# Patient Record
Sex: Female | Born: 1945 | Race: White | Hispanic: No | Smoking: Current every day smoker
Health system: Southern US, Community
[De-identification: ages and names within clinical notes are randomized; demographics above are authoritative.]

## PROBLEM LIST (undated history)

## (undated) DIAGNOSIS — B159 Hepatitis A without hepatic coma: Secondary | ICD-10-CM

## (undated) DIAGNOSIS — I10 Essential (primary) hypertension: Secondary | ICD-10-CM

## (undated) HISTORY — DX: Hepatitis a without hepatic coma: B15.9

## (undated) HISTORY — PX: ABDOMINAL HYSTERECTOMY: SHX81

## (undated) HISTORY — DX: Essential (primary) hypertension: I10

## (undated) HISTORY — PX: CHOLECYSTECTOMY: SHX55

---

## 2014-07-11 DIAGNOSIS — E780 Pure hypercholesterolemia: Secondary | ICD-10-CM | POA: Diagnosis not present

## 2014-07-11 DIAGNOSIS — I10 Essential (primary) hypertension: Secondary | ICD-10-CM | POA: Diagnosis not present

## 2014-07-17 DIAGNOSIS — E780 Pure hypercholesterolemia: Secondary | ICD-10-CM | POA: Diagnosis not present

## 2014-07-17 DIAGNOSIS — J302 Other seasonal allergic rhinitis: Secondary | ICD-10-CM | POA: Diagnosis not present

## 2014-07-17 DIAGNOSIS — I10 Essential (primary) hypertension: Secondary | ICD-10-CM | POA: Diagnosis not present

## 2014-07-17 DIAGNOSIS — J0100 Acute maxillary sinusitis, unspecified: Secondary | ICD-10-CM | POA: Diagnosis not present

## 2014-12-15 DIAGNOSIS — L500 Allergic urticaria: Secondary | ICD-10-CM | POA: Diagnosis not present

## 2015-01-09 DIAGNOSIS — E780 Pure hypercholesterolemia: Secondary | ICD-10-CM | POA: Diagnosis not present

## 2015-01-09 DIAGNOSIS — I10 Essential (primary) hypertension: Secondary | ICD-10-CM | POA: Diagnosis not present

## 2015-01-13 DIAGNOSIS — E780 Pure hypercholesterolemia: Secondary | ICD-10-CM | POA: Diagnosis not present

## 2015-01-13 DIAGNOSIS — Z23 Encounter for immunization: Secondary | ICD-10-CM | POA: Diagnosis not present

## 2015-01-13 DIAGNOSIS — I10 Essential (primary) hypertension: Secondary | ICD-10-CM | POA: Diagnosis not present

## 2016-01-06 ENCOUNTER — Ambulatory Visit (INDEPENDENT_AMBULATORY_CARE_PROVIDER_SITE_OTHER): Payer: Medicare Other | Admitting: Internal Medicine

## 2016-01-06 ENCOUNTER — Other Ambulatory Visit (INDEPENDENT_AMBULATORY_CARE_PROVIDER_SITE_OTHER): Payer: Medicare Other

## 2016-01-06 ENCOUNTER — Encounter: Payer: Self-pay | Admitting: Internal Medicine

## 2016-01-06 DIAGNOSIS — E785 Hyperlipidemia, unspecified: Secondary | ICD-10-CM | POA: Diagnosis not present

## 2016-01-06 DIAGNOSIS — K047 Periapical abscess without sinus: Secondary | ICD-10-CM | POA: Insufficient documentation

## 2016-01-06 DIAGNOSIS — I10 Essential (primary) hypertension: Secondary | ICD-10-CM

## 2016-01-06 DIAGNOSIS — R011 Cardiac murmur, unspecified: Secondary | ICD-10-CM

## 2016-01-06 LAB — COMPREHENSIVE METABOLIC PANEL
ALBUMIN: 4 g/dL (ref 3.5–5.2)
ALT: 13 U/L (ref 0–35)
AST: 20 U/L (ref 0–37)
Alkaline Phosphatase: 73 U/L (ref 39–117)
BILIRUBIN TOTAL: 0.5 mg/dL (ref 0.2–1.2)
BUN: 18 mg/dL (ref 6–23)
CALCIUM: 9 mg/dL (ref 8.4–10.5)
CHLORIDE: 104 meq/L (ref 96–112)
CO2: 30 meq/L (ref 19–32)
Creatinine, Ser: 0.67 mg/dL (ref 0.40–1.20)
GFR: 92.07 mL/min (ref >60.00)
Glucose, Bld: 91 mg/dL (ref 70–99)
Potassium: 4.2 mEq/L (ref 3.5–5.1)
Sodium: 139 mEq/L (ref 135–145)
Total Protein: 7.5 g/dL (ref 6.0–8.3)

## 2016-01-06 LAB — LIPID PANEL
CHOL/HDL RATIO: 3
CHOLESTEROL: 196 mg/dL (ref 0–200)
HDL: 61.5 mg/dL (ref >39.00)
LDL Cholesterol: 112 mg/dL — ABNORMAL HIGH (ref 0–99)
NonHDL: 134.94
TRIGLYCERIDES: 114 mg/dL (ref 0.0–149.0)
VLDL: 22.8 mg/dL (ref 0.0–40.0)

## 2016-01-06 LAB — CBC WITH DIFFERENTIAL/PLATELET
BASOS ABS: 0 10*3/uL (ref 0.0–0.1)
BASOS PCT: 0.2 % (ref 0.0–3.0)
EOS PCT: 0.6 % (ref 0.0–5.0)
Eosinophils Absolute: 0.1 10*3/uL (ref 0.0–0.7)
HEMATOCRIT: 43.4 % (ref 36.0–46.0)
Hemoglobin: 15 g/dL (ref 12.0–15.0)
LYMPHS ABS: 2.7 10*3/uL (ref 0.7–4.0)
Lymphocytes Relative: 29.9 % (ref 12.0–46.0)
MCHC: 34.6 g/dL (ref 30.0–36.0)
MCV: 95 fl (ref 78.0–100.0)
MONOS PCT: 7.4 % (ref 3.0–12.0)
Monocytes Absolute: 0.7 10*3/uL (ref 0.1–1.0)
NEUTROS ABS: 5.6 10*3/uL (ref 1.4–7.7)
NEUTROS PCT: 61.9 % (ref 43.0–77.0)
PLATELETS: 261 10*3/uL (ref 150.0–400.0)
RBC: 4.57 Mil/uL (ref 3.87–5.11)
RDW: 12.1 % (ref 11.5–15.5)
WBC: 9.1 10*3/uL (ref 4.0–10.5)

## 2016-01-06 LAB — TSH: TSH: 1.13 u[IU]/mL (ref 0.35–4.50)

## 2016-01-06 MED ORDER — THERA VITAL M PO TABS: 1.0000 | ORAL_TABLET | Freq: Every day | ORAL | Status: AC

## 2016-01-06 MED ORDER — BENAZEPRIL HCL 10 MG PO TABS: 10.0000 mg | ORAL_TABLET | Freq: Every day | ORAL | 3 refills | Status: DC

## 2016-01-06 MED ORDER — OMEGA 3 1200 MG PO CAPS: ORAL_CAPSULE | ORAL | Status: AC

## 2016-01-06 MED ORDER — VARENICLINE TARTRATE 0.5 MG X 11 & 1 MG X 42 PO MISC: ORAL | 0 refills | Status: DC

## 2016-01-06 MED ORDER — AMLODIPINE BESYLATE 5 MG PO TABS: 5.0000 mg | ORAL_TABLET | Freq: Every day | ORAL | 3 refills | Status: DC

## 2016-01-06 MED ORDER — CLINDAMYCIN HCL 300 MG PO CAPS: 300.0000 mg | ORAL_CAPSULE | Freq: Three times a day (TID) | ORAL | 0 refills | Status: DC

## 2016-01-06 NOTE — Progress Notes (Signed)
Pre visit review using our clinic review tool, if applicable. No additional management support is needed unless otherwise documented below in the visit note. 

## 2016-01-06 NOTE — Assessment & Plan Note (Signed)
Will obtain prior records Consider ordering an echo

## 2016-01-06 NOTE — Patient Instructions (Addendum)
  Test(s) ordered today. Your results will be released to MyChart (or called to you) after review, usually within 72hours after test completion. If any changes need to be made, you will be notified at that same time.   No immunizations administered today.   Medications reviewed and updated.  Changes include starting an antibiotic - clindamycin for your dental infection.    I also sent a prescription for chantix to your pharmacy.  Your prescription(s) have been submitted to your pharmacy. Please take as directed and contact our office if you believe you are having problem(s) with the medication(s).   Please followup in 6 months

## 2016-01-06 NOTE — Assessment & Plan Note (Signed)
BP well controlled, at goal of < 150/90 Current regimen effective and well tolerated Continue current medications at current doses Check labs

## 2016-01-06 NOTE — Progress Notes (Signed)
Subjective:    Patient ID: Holly Gilmore, female    DOB: Mar 03, 1946, 70 y.o.   MRN: [id number]  HPI She is here to establish with a new pcp.    She has a dental infection.  She did see a dentist and has an infection and she needs to see an orthodontist, but can not see one right away.  She was hoping to get an antibiotic so she can wait until next month.  She has no pain.  She has no fever.    Hypertension: She is taking her medication daily. She is compliant with a low sodium diet.  She denies chest pain, palpitations, edema, shortness of breath and regular headaches. She is not exercising regularly, but is active.  She does not monitor her blood pressure at home.     Medications and allergies reviewed with patient and updated if appropriate.  Patient Active Problem List   Diagnosis Date Noted  . Essential hypertension, benign 01/06/2016  . Dental infection 01/06/2016  . Hyperlipidemia 01/06/2016  . Heart murmur 01/06/2016    No current outpatient prescriptions on file prior to visit.   No current facility-administered medications on file prior to visit.     Past Medical History:  Diagnosis Date  . Hepatitis A   . Hypertension     Past Surgical History:  Procedure Laterality Date  . ABDOMINAL HYSTERECTOMY    . CHOLECYSTECTOMY      Social History   Social History  . Marital status: Widowed    Spouse name: N/A  . Number of children: N/A  . Years of education: N/A   Social History Main Topics  . Smoking status: Current Every Day Smoker  . Smokeless tobacco: Never Used     Comment: working on quitting - plans on going cold [place]  . Alcohol use Yes     Comment: occasional  . Drug use: No  . Sexual activity: Not Asked   Other Topics Concern  . None   Social History Narrative   No regular exercise   Gardening, canning    Family History  Problem Relation Age of Onset  . Arthritis Mother   . Hypertension Mother   . Cancer Father     prost  .  Alcohol abuse Brother     Review of Systems  Constitutional: Negative for appetite change, chills, fatigue, fever and unexpected weight change.  HENT: Positive for dental problem. Negative for sore throat.   Eyes: Negative for visual disturbance.  Respiratory: Positive for cough (from smoking). Negative for shortness of breath and wheezing.   Cardiovascular: Negative for chest pain, palpitations and leg swelling.  Gastrointestinal: Negative for abdominal pain, blood in stool, constipation, diarrhea and nausea.       No gerd  Genitourinary: Negative for dysuria and hematuria.  Musculoskeletal: Negative for arthralgias and back pain.  Neurological: Negative for light-headedness and headaches.  Psychiatric/Behavioral: Negative for dysphoric mood. The patient is not nervous/anxious.        Objective:   Vitals:   01/06/16 1005  BP: 140/80  Pulse: (!) 57  Resp: 16  Temp: 97.4 F (36.3 C)   Filed Weights   01/06/16 1005  Weight: 144 lb (65.3 kg)   Body mass index is 22.89 kg/m.   Physical Exam Constitutional: She appears well-developed and well-nourished. No distress.  HENT:  Head: Normocephalic and atraumatic.  Right Ear: External ear normal. Normal ear canal and TM Left Ear: External ear normal.  Normal ear canal  and TM Mouth/Throat: Oropharynx is clear and moist.  Eyes: Conjunctivae and EOM are normal.  Neck: Neck supple. No tracheal deviation present. No thyromegaly present.  No carotid bruit  Cardiovascular: Normal rate, regular rhythm and normal heart sounds.   2/6 systolic murmur heard.  No edema. Pulmonary/Chest: Effort normal and breath sounds normal. No respiratory distress. She has no wheezes. She has no rales.  Abdominal: Soft. She exhibits no distension. There is no tenderness.  Lymphadenopathy: She has no cervical adenopathy.  Skin: Skin is warm and dry. She is not diaphoretic.  Psychiatric: She has a normal mood and affect. Her behavior is normal.         Assessment & Plan:   See Problem List for Assessment and Plan of chronic medical problems.

## 2016-01-06 NOTE — Assessment & Plan Note (Signed)
Infection confirmed by dentist - was referred to orthodontist Unable to go until next month - I will prescribe clindamycin so she can wait until next month when she can afford it She will f/u with orthodontist

## 2016-01-06 NOTE — Assessment & Plan Note (Signed)
Check lipid panel  

## 2016-01-13 DIAGNOSIS — H2513 Age-related nuclear cataract, bilateral: Secondary | ICD-10-CM | POA: Diagnosis not present

## 2016-01-13 DIAGNOSIS — H5203 Hypermetropia, bilateral: Secondary | ICD-10-CM | POA: Diagnosis not present

## 2016-01-13 DIAGNOSIS — H04123 Dry eye syndrome of bilateral lacrimal glands: Secondary | ICD-10-CM | POA: Diagnosis not present

## 2016-01-13 DIAGNOSIS — H524 Presbyopia: Secondary | ICD-10-CM | POA: Diagnosis not present

## 2016-02-12 ENCOUNTER — Other Ambulatory Visit: Payer: Self-pay | Admitting: Internal Medicine

## 2016-02-12 MED ORDER — VARENICLINE TARTRATE 1 MG PO TABS: 1.0000 mg | ORAL_TABLET | Freq: Two times a day (BID) | ORAL | 0 refills | Status: DC

## 2016-02-12 NOTE — Telephone Encounter (Signed)
Refill sent for continuing pack, starter pack refused.

## 2016-02-23 DIAGNOSIS — Z23 Encounter for immunization: Secondary | ICD-10-CM | POA: Diagnosis not present

## 2016-07-03 ENCOUNTER — Emergency Department (HOSPITAL_COMMUNITY): Payer: PPO

## 2016-07-03 ENCOUNTER — Encounter (HOSPITAL_COMMUNITY): Payer: Self-pay | Admitting: Emergency Medicine

## 2016-07-03 ENCOUNTER — Emergency Department (HOSPITAL_COMMUNITY)
Admission: EM | Admit: 2016-07-03 | Discharge: 2016-07-03 | Disposition: A | Discharge status: Home / Self Care | Payer: PPO | Attending: Emergency Medicine | Admitting: Emergency Medicine

## 2016-07-03 DIAGNOSIS — Z79899 Other long term (current) drug therapy: Secondary | ICD-10-CM | POA: Insufficient documentation

## 2016-07-03 DIAGNOSIS — J209 Acute bronchitis, unspecified: Secondary | ICD-10-CM | POA: Diagnosis not present

## 2016-07-03 DIAGNOSIS — J40 Bronchitis, not specified as acute or chronic: Secondary | ICD-10-CM | POA: Diagnosis not present

## 2016-07-03 DIAGNOSIS — I10 Essential (primary) hypertension: Secondary | ICD-10-CM | POA: Insufficient documentation

## 2016-07-03 DIAGNOSIS — R05 Cough: Secondary | ICD-10-CM | POA: Diagnosis not present

## 2016-07-03 DIAGNOSIS — Z87891 Personal history of nicotine dependence: Secondary | ICD-10-CM | POA: Insufficient documentation

## 2016-07-03 MED ORDER — ALBUTEROL SULFATE HFA 108 (90 BASE) MCG/ACT IN AERS
2.0000 | INHALATION_SPRAY | Freq: Once | RESPIRATORY_TRACT | Status: AC
  Administered 2016-07-03: 2 via RESPIRATORY_TRACT
  Filled 2016-07-03: qty 6.7

## 2016-07-03 NOTE — ED Notes (Signed)
Pt refuses chest x-ray. Radiology and MD notified.

## 2016-07-03 NOTE — ED Triage Notes (Signed)
Patient c/o cough and sore throat that started this morning. Unsure of any fevers.

## 2016-07-03 NOTE — ED Provider Notes (Signed)
[facility]Y [facility]PT Provider Note   CSN: [id number] Arrival date & time: 07/03/16  4098  By signing my name below, I, Doreatha Martin, attest that this documentation has been prepared under the direction and in the presence of Azalia Bilis, MD. Electronically Signed: Doreatha Martin, ED Scribe. 07/03/16. 10:34 AM.    History   Chief Complaint Chief Complaint  Patient presents with  . Cough    HPI Holly Gilmore is a 71 y.o. female who presents to the Emergency Department complaining of improving, frequent cough that began last night with associated bilateral arm aching. She also describes the sensation that a "fist was stuck in her throat" last night, which has currently resolved. Pt states her symptoms have improved since last night. No worsening factors noted. No h/o asthma. Pt is a former intermittent smoker since age 79, quit 5.5 months ago. She denies fever, nausea, vomiting.    The history is provided by the patient. No language interpreter was used.    Past Medical History:  Diagnosis Date  . Hepatitis A   . Hypertension     Patient Active Problem List   Diagnosis Date Noted  . Essential hypertension, benign 01/06/2016  . Dental infection 01/06/2016  . Hyperlipidemia 01/06/2016  . Heart murmur 01/06/2016    Past Surgical History:  Procedure Laterality Date  . ABDOMINAL HYSTERECTOMY    . CHOLECYSTECTOMY      OB History    Gravida Para Term Preterm AB Living   2 2 2     1    SAB TAB Ectopic Multiple Live Births                   Home Medications    Prior to Admission medications   Medication Sig Start Date End Date Taking? Authorizing Provider  amLODipine (NORVASC) 5 MG tablet Take 1 tablet (5 mg total) by mouth daily. 01/06/16   Pincus Sanes, MD  benazepril (LOTENSIN) 10 MG tablet Take 1 tablet (10 mg total) by mouth daily. 01/06/16   Pincus Sanes, MD  clindamycin (CLEOCIN) 300 MG capsule Take 1 capsule (300 mg total) by mouth 3 (three) times daily. 01/06/16    Pincus Sanes, MD  cycloSPORINE (RESTASIS) 0.05 % ophthalmic emulsion 1 drop 2 (two) times daily.    Historical Provider, MD  Multiple Vitamins-Minerals (MULTIVITAMIN) tablet Take 1 tablet by mouth daily. 01/06/16   Pincus Sanes, MD  Omega 3 1200 MG CAPS Takes daily 01/06/16   Pincus Sanes, MD  varenicline (CHANTIX CONTINUING MONTH PAK) 1 MG tablet Take 1 tablet (1 mg total) by mouth 2 (two) times daily. 02/12/16   Pincus Sanes, MD  varenicline (CHANTIX STARTING MONTH PAK) 0.5 MG X 11 & 1 MG X 42 tablet one 0.5 mg tab by mouth once daily for 3 days, then increase to one 0.5 mg tab BID for 4 days, then increase to one 1 mg tab BID 01/06/16   Pincus Sanes, MD    Family History Family History  Problem Relation Age of Onset  . Arthritis Mother   . Hypertension Mother   . Cancer Father     prost  . Alcohol abuse Brother     Social History Social History  Substance Use Topics  . Smoking status: Former Smoker    Packs/day: 0.50    Years: 40.00    Types: Cigarettes    Quit date: 01/30/2016  . Smokeless tobacco: Never Used     Comment: working on  quitting - plans on going cold [place]  . Alcohol use Yes     Comment: occasional     Allergies   Penicillins   Review of Systems Review of Systems A complete 10 system review of systems was obtained and all systems are negative except as noted in the HPI and PMH.    Physical Exam Updated Vital Signs BP 161/81 (BP Location: Right Arm)   Pulse 76   Temp 98.4 F (36.9 C) (Oral)   Resp 18   Ht 5\' 6"  (1.676 m)   Wt 150 lb (68 kg)   SpO2 97%   BMI 24.21 kg/m   Physical Exam  Constitutional: She is oriented to person, place, and time. She appears well-developed and well-nourished. No distress.  HENT:  Head: Normocephalic and atraumatic.  Eyes: EOM are normal.  Neck: Normal range of motion.  Cardiovascular: Normal rate, regular rhythm and normal heart sounds.   Pulmonary/Chest: Effort normal and breath sounds normal. She has no  wheezes. She has no rales.  Abdominal: Soft. She exhibits no distension. There is no tenderness.  Musculoskeletal: Normal range of motion.  Neurological: She is alert and oriented to person, place, and time.  Skin: Skin is warm and dry.  Psychiatric: She has a normal mood and affect. Judgment normal.  Nursing note and vitals reviewed.    ED Treatments / Results   DIAGNOSTIC STUDIES: Oxygen Saturation is 97% on RA, normal by my interpretation.    COORDINATION OF CARE: 10:32 AM Discussed treatment plan with pt at bedside which includes CXR, albuterol inhaler and pt agreed to plan.    Labs (all labs ordered are listed, but only abnormal results are displayed) Labs Reviewed - No data to display  EKG  EKG Interpretation None       Radiology No results found.  Procedures Procedures (including critical care time)  Medications Ordered in ED Medications - No data to display   Initial Impression / Assessment and Plan / ED Course  I have reviewed the triage vital signs and the nursing notes.  Pertinent labs & imaging results that were available during my care of the patient were reviewed by me and considered in my medical decision making (see chart for details).     Patient is overall well-appearing.  Vital signs are stable.  This is likely bronchitis.  Patient be given albuterol inhaler.  I felt as though the patient benefit from a chest x-ray to evaluate for occult pneumonia versus other symptoms which may be causing her cough.  Patient refuses chest x-ray at this time and understands that this is an incomplete evaluation emergency department.  She understands to return to the ER for new or worsening symptoms.  I'm not convinced clinically that she has pneumonia and therefore I do not think she needs antibiotics.  Discharge home in good condition.  Final Clinical Impressions(s) / ED Diagnoses   Final diagnoses:  Bronchitis    New Prescriptions Current Discharge  Medication List      I personally performed the services described in this documentation, which was scribed in my presence. The recorded information has been reviewed and is accurate.        Azalia BilisKevin Javian Nudd, MD 07/03/16 1110

## 2016-07-13 ENCOUNTER — Ambulatory Visit: Payer: Medicare Other | Admitting: Internal Medicine

## 2016-07-21 ENCOUNTER — Ambulatory Visit (INDEPENDENT_AMBULATORY_CARE_PROVIDER_SITE_OTHER): Payer: PPO | Admitting: Internal Medicine

## 2016-07-21 ENCOUNTER — Encounter: Payer: Self-pay | Admitting: Internal Medicine

## 2016-07-21 VITALS — BP 172/80 | HR 65 | Temp 97.7°F | Resp 16 | Ht 66.0 in | Wt 167.0 lb

## 2016-07-21 DIAGNOSIS — I10 Essential (primary) hypertension: Secondary | ICD-10-CM | POA: Diagnosis not present

## 2016-07-21 DIAGNOSIS — R011 Cardiac murmur, unspecified: Secondary | ICD-10-CM

## 2016-07-21 MED ORDER — MUPIROCIN CALCIUM 2 % EX CREA: 1.0000 "application " | TOPICAL_CREAM | Freq: Two times a day (BID) | CUTANEOUS | 2 refills | Status: DC | PRN

## 2016-07-21 MED ORDER — BENAZEPRIL HCL 20 MG PO TABS: 20.0000 mg | ORAL_TABLET | Freq: Every day | ORAL | 3 refills | Status: DC

## 2016-07-21 NOTE — Progress Notes (Signed)
Pre visit review using our clinic review tool, if applicable. No additional management support is needed unless otherwise documented below in the visit note. 

## 2016-07-21 NOTE — Progress Notes (Signed)
Subjective:    Patient ID: Holly Gilmore, female    DOB: [birth year], 71 y.o.   MRN: [id number]  HPI The patient is here for follow up.  Hypertension: She is taking her medication daily. She is compliant with a low sodium diet.  She denies chest pain, palpitations, edema, shortness of breath and regular headaches. She is exercising regularly.  She does not monitor her blood pressure at home.    Hyperlipidemia: She is controlling her cholesterol with diet. She is compliant with a low fat/cholesterol diet. She is exercising regularly.   She would like bactroban to have at home for nicks she gets when she gardens.   Medications and allergies reviewed with patient and updated if appropriate.  Patient Active Problem List   Diagnosis Date Noted  . Essential hypertension, benign 01/06/2016  . Dental infection 01/06/2016  . Hyperlipidemia 01/06/2016  . Heart murmur 01/06/2016    Current Outpatient Prescriptions on File Prior to Visit  Medication Sig Dispense Refill  . amLODipine (NORVASC) 5 MG tablet Take 1 tablet (5 mg total) by mouth daily. 90 tablet 3  . benazepril (LOTENSIN) 10 MG tablet Take 1 tablet (10 mg total) by mouth daily. 90 tablet 3  . cycloSPORINE (RESTASIS) 0.05 % ophthalmic emulsion 1 drop 2 (two) times daily.    . Multiple Vitamins-Minerals (MULTIVITAMIN) tablet Take 1 tablet by mouth daily.    . Omega 3 1200 MG CAPS Takes daily    . vitamin C (ASCORBIC ACID) 500 MG tablet Take 1,000 mg by mouth daily.     No current facility-administered medications on file prior to visit.     Past Medical History:  Diagnosis Date  . Hepatitis A   . Hypertension     Past Surgical History:  Procedure Laterality Date  . ABDOMINAL HYSTERECTOMY    . CHOLECYSTECTOMY      Social History   Social History  . Marital status: Widowed    Spouse name: N/A  . Number of children: N/A  . Years of education: N/A   Social History Main Topics  . Smoking status: Former Smoker    Packs/day: 0.50    Years: 40.00    Types: Cigarettes    Quit date: 01/30/2016  . Smokeless tobacco: Never Used     Comment: working on quitting - plans on going cold [place]  . Alcohol use Yes     Comment: occasional  . Drug use: No  . Sexual activity: Not on file   Other Topics Concern  . Not on file   Social History Narrative   No regular exercise   Gardening, canning    Family History  Problem Relation Age of Onset  . Arthritis Mother   . Hypertension Mother   . Cancer Father     prost  . Alcohol abuse Brother     Review of Systems  Constitutional: Negative for fever.  Eyes: Positive for visual disturbance (had eye exam in last few months).  Respiratory: Negative for cough, shortness of breath and wheezing.   Cardiovascular: Negative for chest pain, palpitations and leg swelling.  Neurological: Negative for dizziness, light-headedness and headaches.       Objective:   Vitals:   07/21/16 0954 07/21/16 1008  BP: (!) 204/90 (!) 172/80  Pulse: 65   Resp: 16   Temp: 97.7 F (36.5 C)    Wt Readings from Last 3 Encounters:  07/21/16 167 lb (75.8 kg)  07/03/16 150 lb (68 kg)  01/06/16 144  lb (65.3 kg)   Body mass index is 26.95 kg/m.   Physical Exam    Constitutional: Appears well-developed and well-nourished. No distress.  HENT:  Head: Normocephalic and atraumatic.  Neck: Neck supple. No tracheal deviation present. No thyromegaly present.  No cervical lymphadenopathy Cardiovascular: Normal rate, regular rhythm and normal heart sounds.   2/6 systolic murmur heard. No carotid bruit .  No edema Pulmonary/Chest: Effort normal and breath sounds normal. No respiratory distress. No has no wheezes. No rales.  Skin: Skin is warm and dry. Not diaphoretic.  Psychiatric: Normal mood and affect. Behavior is normal.      Assessment & Plan:    See Problem List for Assessment and Plan of chronic medical problems.

## 2016-07-21 NOTE — Patient Instructions (Addendum)
   All other Health Maintenance issues reviewed.   All recommended immunizations and age-appropriate screenings are up-to-date or discussed.  No immunizations administered today.   Medications reviewed and updated.  Changes include increasing benazepril to 20 mg daily.  Continue the amlodipine 5 mg daily.   Your prescription(s) have been submitted to your pharmacy. Please take as directed and contact our office if you believe you are having problem(s) with the medication(s).   Please followup in 4 weeks

## 2016-07-21 NOTE — Assessment & Plan Note (Addendum)
Elevated today  - possibly related to weight gain with quitting smoke, ? Stress from driving here Discussed goal of BP < 140/90 and ideally < 130/80 Will amlodipine 5 mg daily Increase benazepril to 20 mg daily cmp

## 2016-07-21 NOTE — Assessment & Plan Note (Signed)
2/6 murmur Will monitor Stressed good BP control Consider echo

## 2016-09-06 ENCOUNTER — Telehealth: Payer: Self-pay | Admitting: Internal Medicine

## 2016-09-06 NOTE — Telephone Encounter (Signed)
Please advise 

## 2016-09-06 NOTE — Telephone Encounter (Signed)
Pt called stating benazepril (LOTENSIN) 20 MG tablet used to be  and would like to have it back to . States  is too much, but is not causing her any negative symptoms.   Our connection broke and I was not able to ask her pharmacy, called back three times and line busy. Will edit note if she calls back

## 2016-09-06 NOTE — Telephone Encounter (Signed)
What has her BP been at home?   If controlled we can decrease back to 10 mg daily, but she needs to monitor BP closely

## 2016-09-07 MED ORDER — BENAZEPRIL HCL 10 MG PO TABS: 10.0000 mg | ORAL_TABLET | Freq: Every day | ORAL | 1 refills | Status: DC

## 2016-09-07 NOTE — Telephone Encounter (Signed)
Called pt no answer LMOM RTC.../lmb 

## 2016-09-07 NOTE — Telephone Encounter (Signed)
Pt states her BP 138/68 a couple days ago while doing activity and taking  of her BP medicine.

## 2016-09-07 NOTE — Telephone Encounter (Signed)
Notified pt w/MD response. Pt states she never took 20 mg, always been taking , and want to continue. The script that was sent in back in March pt states she has been cutting the pills in half. Requesting her script to be change at the pharmacy for 10 mg.../lmb

## 2016-09-07 NOTE — Telephone Encounter (Signed)
Ok, sent

## 2016-09-07 NOTE — Telephone Encounter (Signed)
That BP of 138/68 is good - I would not want it any higher - if she was taking the 20 mg of benazepril at that time she likely needs to continue that dose to prevent her BP from going higher.

## 2016-09-30 DIAGNOSIS — H16223 Keratoconjunctivitis sicca, not specified as Sjogren's, bilateral: Secondary | ICD-10-CM | POA: Diagnosis not present

## 2016-09-30 DIAGNOSIS — H2513 Age-related nuclear cataract, bilateral: Secondary | ICD-10-CM | POA: Diagnosis not present

## 2017-01-12 ENCOUNTER — Other Ambulatory Visit: Payer: Self-pay | Admitting: Internal Medicine

## 2017-03-08 NOTE — Progress Notes (Signed)
Pre visit review using our clinic review tool, if applicable. No additional management support is needed unless otherwise documented below in the visit note. 

## 2017-03-08 NOTE — Progress Notes (Addendum)
Subjective:   Holly Gilmore is a 71 y.o. female who presents for an Initial Medicare Annual Wellness Visit.  Review of Systems    No ROS.  Medicare Wellness Visit. Additional risk factors are reflected in the social history.   Cardiac Risk Factors include: advanced age (>73men, >46 women);dyslipidemia;hypertension Sleep patterns: feels rested on waking, gets up 1 times nightly to void and sleeps 7-8 hours nightly.    Home Safety/Smoke Alarms: Feels safe in home. Smoke alarms in place.  Living environment; residence and Firearm Safety: 2-story house, no firearms.Lives alone, no needs for DME, good support system Seat Belt Safety/Bike Helmet: Wears seat belt.    Objective:    Today's Vitals   03/09/17 1127  BP: (!) 158/82  Pulse: 63  Resp: 20  SpO2: 99%  Weight: 155 lb (70.3 kg)  Height: 5\' 6"  (1.676 m)   Body mass index is 25.02 kg/m.   Current Medications (verified) Outpatient Encounter Prescriptions as of 03/09/2017  Medication Sig  . amLODipine (NORVASC) 5 MG tablet TAKE 1 TABLET (5 MG TOTAL) BY MOUTH DAILY.  . benazepril (LOTENSIN) 10 MG tablet Take 1 tablet (10 mg total) by mouth daily.  . cycloSPORINE (RESTASIS) 0.05 % ophthalmic emulsion 1 drop 2 (two) times daily.  . Multiple Vitamins-Minerals (MULTIVITAMIN) tablet Take 1 tablet by mouth daily.  . mupirocin cream (BACTROBAN) 2 % Apply 1 application topically 2 (two) times daily as needed.  . Omega 3 1200 MG CAPS Takes daily  . vitamin C (ASCORBIC ACID) 500 MG tablet Take 1,000 mg by mouth daily.   No facility-administered encounter medications on file as of 03/09/2017.     Allergies (verified) Penicillins   History: Past Medical History:  Diagnosis Date  . Hepatitis A   . Hypertension    Past Surgical History:  Procedure Laterality Date  . ABDOMINAL HYSTERECTOMY    . CHOLECYSTECTOMY     Family History  Problem Relation Age of Onset  . Arthritis Mother   . Hypertension Mother   . Cancer  Father        prost  . Alcohol abuse Brother    Social History   Occupational History  . Not on file.   Social History Main Topics  . Smoking status: Current Every Day Smoker    Packs/day: 1.50    Years: 40.00    Types: Cigarettes    Last attempt to quit: 01/30/2016  . Smokeless tobacco: Never Used     Comment: patient states that she started smoking again and does not want to try to quit again at this time.  . Alcohol use Yes     Comment: occasional  . Drug use: No  . Sexual activity: Not on file    Tobacco Counseling Ready to quit: Not Answered Counseling given: Not Answered   Activities of Daily Living In your present state of health, do you have any difficulty performing the following activities: 03/09/2017  Hearing? N  Vision? N  Difficulty concentrating or making decisions? N  Walking or climbing stairs? N  Dressing or bathing? N  Doing errands, shopping? N  Preparing Food and eating ? N  Using the Toilet? N  In the past six months, have you accidently leaked urine? N  Do you have problems with loss of bowel control? N  Managing your Medications? N  Managing your Finances? N  Housekeeping or managing your Housekeeping? N  Some recent data might be hidden    Immunizations and Health Maintenance Immunization  History  Administered Date(s) Administered  . Influenza-Unspecified 02/23/2016  . Pneumococcal Polysaccharide-23 05/17/2010  . Zoster 05/18/2003   Health Maintenance Due  Topic Date Due  . Hepatitis C Screening  01/23/1946  . TETANUS/TDAP  06/08/1963  . MAMMOGRAM  06/07/1994  . COLONOSCOPY  06/07/1994  . DEXA SCAN  06/07/2009  . PNA vac Low Risk Adult (2 of 2 - PCV13) 05/18/2011  . INFLUENZA VACCINE  12/15/2016    Patient Care Team: Pincus Sanes, MD as PCP - General (Internal Medicine)  Indicate any recent Medical Services you may have received from other than [facility] providers in the past year (date may be approximate).     Assessment:    This is a routine wellness examination for Tom. Physical assessment deferred to PCP.   Hearing/Vision screen Hearing Screening Comments: Able to hear conversational tones w/o difficulty. No issues reported.  Passed whisper test Vision Screening Comments: appointment yearly Dr. Cathey Endow  Dietary issues and exercise activities discussed: Current Exercise Habits: The patient has a physically strenous job, but has no regular exercise apart from work. (Works Emergency planning/management officer), Exercise limited by: None identified  Diet (meal preparation, eat out, water intake, caffeinated beverages, dairy products, fruits and vegetables): in general, a "healthy" diet  , well balanced   Reviewed heart healthy and diabetic diet, encouraged patient to increase daily water intake.  Goals    . maintain current health status          Continue to be as active and as independent as possible      Depression Screen PHQ 2/9 Scores 03/09/2017 01/06/2016  PHQ - 2 Score 0 0  PHQ- 9 Score 1 -    Fall Risk Fall Risk  03/09/2017 01/06/2016  Falls in the past year? No No    Cognitive Function: MMSE - Mini Mental State Exam 03/09/2017  Orientation to time 5       Ad8 score reviewed for issues:  Issues making decisions: no  Less interest in hobbies / activities: no  Repeats questions, stories (family complaining): no  Trouble using ordinary gadgets (microwave, computer, phone):no  Forgets the month or year: no  Mismanaging finances: no  Remembering appts: no  Daily problems with thinking and/or memory: no Ad8 score is= 0  Screening Tests Health Maintenance  Topic Date Due  . Hepatitis C Screening  [birth year]  . TETANUS/TDAP  06/08/1963  . MAMMOGRAM  06/07/1994  . COLONOSCOPY  06/07/1994  . DEXA SCAN  06/07/2009  . PNA vac Low Risk Adult (2 of 2 - PCV13) 05/18/2011  . INFLUENZA VACCINE  12/15/2016      Plan:     Patient states she does not believe in doing many of the health  maintenance recommendations. Patient declined mammogram, colonoscopy and cologuard, bone density scan, and PNA-13, education was provided.  Continue doing brain stimulating activities (puzzles, reading, adult coloring books, staying active) to keep memory sharp.   Continue to eat heart healthy diet (full of fruits, vegetables, whole grains, lean protein, water--limit salt, fat, and sugar intake) and increase physical activity as tolerated.  I have personally reviewed and noted the following in the patient's chart:   . Medical and social history . Use of alcohol, tobacco or illicit drugs  . Current medications and supplements . Functional ability and status . Nutritional status . Physical activity . Advanced directives . List of other physicians . Vitals . Screenings to include cognitive, depression, and falls . Referrals and appointments  In addition, I  have reviewed and discussed with patient certain preventive protocols, quality metrics, and best practice recommendations. A written personalized care plan for preventive services as well as general preventive health recommendations were provided to patient.     Wanda PlumpJill A Damara Klunder, RN   03/09/2017    Medical screening examination/treatment/procedure(s) were performed by non-physician practitioner and as supervising physician I was immediately available for consultation/collaboration. I agree with above. Pincus SanesStacy J Burns, MD

## 2017-03-09 ENCOUNTER — Ambulatory Visit (INDEPENDENT_AMBULATORY_CARE_PROVIDER_SITE_OTHER): Payer: PPO | Admitting: *Deleted

## 2017-03-09 VITALS — BP 158/82 | HR 63 | Resp 20 | Ht 66.0 in | Wt 155.0 lb

## 2017-03-09 DIAGNOSIS — Z0000 Encounter for general adult medical examination without abnormal findings: Secondary | ICD-10-CM | POA: Diagnosis not present

## 2017-03-09 NOTE — Patient Instructions (Signed)
Continue doing brain stimulating activities (puzzles, reading, adult coloring books, staying active) to keep memory sharp.   Continue to eat heart healthy diet (full of fruits, vegetables, whole grains, lean protein, water--limit salt, fat, and sugar intake) and increase physical activity as tolerated.   Holly Gilmore , Thank you for taking time to come for your Medicare Wellness Visit. I appreciate your ongoing commitment to your health goals. Please review the following plan we discussed and let me know if I can assist you in the future.   These are the goals we discussed: Goals    . maintain current health status          Continue to be as active and as independent as possible       This is a list of the screening recommended for you and due dates:  Health Maintenance  Topic Date Due  .  Hepatitis C: One time screening is recommended by Center for Disease Control  (CDC) for  adults born from [birth year] through 1966.   12/18/1945  . Tetanus Vaccine  06/08/1963  . Mammogram  06/07/1994  . Colon Cancer Screening  06/07/1994  . DEXA scan (bone density measurement)  06/07/2009  . Pneumonia vaccines (2 of 2 - PCV13) 05/18/2011  . Flu Shot  12/15/2016

## 2017-04-18 ENCOUNTER — Other Ambulatory Visit: Payer: Self-pay | Admitting: Internal Medicine

## 2017-04-20 ENCOUNTER — Other Ambulatory Visit: Payer: Self-pay | Admitting: Internal Medicine

## 2017-04-20 NOTE — Telephone Encounter (Signed)
Copied from CRM [id number]. Topic: General - Other >> Apr 20, 2017  2:10 PM Cecelia ByarsGreen, Zehava Turski L, RMA wrote: Reason for CRM: medication refill request for Benazepril 10 mg to be sent to [facility]

## 2017-04-20 NOTE — Telephone Encounter (Signed)
Spoke with patient, she has been informed.

## 2017-05-04 ENCOUNTER — Other Ambulatory Visit: Payer: Self-pay | Admitting: Internal Medicine

## 2017-05-04 ENCOUNTER — Other Ambulatory Visit: Payer: Self-pay

## 2017-05-04 ENCOUNTER — Telehealth: Payer: Self-pay | Admitting: Internal Medicine

## 2017-05-04 MED ORDER — AMLODIPINE BESYLATE 5 MG PO TABS: 5.0000 mg | ORAL_TABLET | Freq: Every day | ORAL | 0 refills | Status: DC

## 2017-05-04 NOTE — Telephone Encounter (Signed)
Copied from CRM [phone]. Topic: Quick Communication - See Telephone Encounter >> May 04, 2017  1:35 PM Guinevere FerrariMorris, Jocilyn Trego E, NT wrote: CRM for notification. See Telephone encounter for: Pt is calling to get a refill on amLODipine (NORVASC) 5 MG tablet. Pt has checked with the pharmacy already and said that several request has already been sent. [id number] - [place], [place] - [address] AT [facility].  05/04/17.

## 2017-05-25 ENCOUNTER — Other Ambulatory Visit (INDEPENDENT_AMBULATORY_CARE_PROVIDER_SITE_OTHER): Payer: PPO

## 2017-05-25 ENCOUNTER — Ambulatory Visit (INDEPENDENT_AMBULATORY_CARE_PROVIDER_SITE_OTHER): Payer: PPO | Admitting: Family

## 2017-05-25 ENCOUNTER — Ambulatory Visit (INDEPENDENT_AMBULATORY_CARE_PROVIDER_SITE_OTHER)
Admission: RE | Admit: 2017-05-25 | Discharge: 2017-05-25 | Disposition: A | Discharge status: Home / Self Care | Payer: PPO | Source: Ambulatory Visit | Attending: Family | Admitting: Family

## 2017-05-25 ENCOUNTER — Encounter: Payer: Self-pay | Admitting: Family

## 2017-05-25 VITALS — BP 138/74 | HR 74 | Temp 97.9°F | Ht 66.0 in | Wt 150.1 lb

## 2017-05-25 DIAGNOSIS — R591 Generalized enlarged lymph nodes: Secondary | ICD-10-CM

## 2017-05-25 DIAGNOSIS — R05 Cough: Secondary | ICD-10-CM

## 2017-05-25 DIAGNOSIS — R059 Cough, unspecified: Secondary | ICD-10-CM

## 2017-05-25 LAB — COMPREHENSIVE METABOLIC PANEL
ALBUMIN: 3.9 g/dL (ref 3.5–5.2)
ALK PHOS: 83 U/L (ref 39–117)
ALT: 12 U/L (ref 0–35)
AST: 18 U/L (ref 0–37)
BILIRUBIN TOTAL: 0.5 mg/dL (ref 0.2–1.2)
BUN: 19 mg/dL (ref 6–23)
CO2: 29 mEq/L (ref 19–32)
CREATININE: 0.63 mg/dL (ref 0.40–1.20)
Calcium: 9.4 mg/dL (ref 8.4–10.5)
Chloride: 105 mEq/L (ref 96–112)
GFR: 98.46 mL/min (ref >60.00)
GLUCOSE: 100 mg/dL — AB (ref 70–99)
Potassium: 4.1 mEq/L (ref 3.5–5.1)
Sodium: 141 mEq/L (ref 135–145)
TOTAL PROTEIN: 7.8 g/dL (ref 6.0–8.3)

## 2017-05-25 LAB — CBC WITH DIFFERENTIAL/PLATELET
BASOS ABS: 0.1 10*3/uL (ref 0.0–0.1)
Basophils Relative: 0.8 % (ref 0.0–3.0)
EOS ABS: 0.1 10*3/uL (ref 0.0–0.7)
EOS PCT: 1.2 % (ref 0.0–5.0)
HCT: 44.3 % (ref 36.0–46.0)
HEMOGLOBIN: 15.1 g/dL — AB (ref 12.0–15.0)
Lymphocytes Relative: 28 % (ref 12.0–46.0)
Lymphs Abs: 2.1 10*3/uL (ref 0.7–4.0)
MCHC: 34 g/dL (ref 30.0–36.0)
MCV: 96.5 fl (ref 78.0–100.0)
MONO ABS: 0.7 10*3/uL (ref 0.1–1.0)
Monocytes Relative: 9.6 % (ref 3.0–12.0)
NEUTROS PCT: 60.4 % (ref 43.0–77.0)
Neutro Abs: 4.5 10*3/uL (ref 1.4–7.7)
Platelets: 292 10*3/uL (ref 150.0–400.0)
RBC: 4.59 Mil/uL (ref 3.87–5.11)
RDW: 12.1 % (ref 11.5–15.5)
WBC: 7.4 10*3/uL (ref 4.0–10.5)

## 2017-05-25 MED ORDER — BENZONATATE 200 MG PO CAPS: 200.0000 mg | ORAL_CAPSULE | Freq: Three times a day (TID) | ORAL | 0 refills | Status: DC | PRN

## 2017-05-25 MED ORDER — DOXYCYCLINE HYCLATE 100 MG PO TABS: 100.0000 mg | ORAL_TABLET | Freq: Two times a day (BID) | ORAL | 0 refills | Status: DC

## 2017-05-25 MED ORDER — ALBUTEROL SULFATE HFA 108 (90 BASE) MCG/ACT IN AERS: 2.0000 | INHALATION_SPRAY | Freq: Four times a day (QID) | RESPIRATORY_TRACT | 0 refills | Status: DC | PRN

## 2017-05-25 NOTE — Progress Notes (Signed)
Holly Gilmore is a 72 y.o. female with the following history as recorded in EpicCare:  Patient Active Problem List   Diagnosis Date Noted  . Essential hypertension, benign 01/06/2016  . Dental infection 01/06/2016  . Hyperlipidemia 01/06/2016  . Heart murmur 01/06/2016    Current Outpatient Medications  Medication Sig Dispense Refill  . amLODipine (NORVASC) 5 MG tablet Take 1 tablet (5 mg total) by mouth daily. 90 tablet 0  . benazepril (LOTENSIN) 10 MG tablet Take 1 tablet (10 mg total) by mouth daily. -- Office visit needed for further refills 30 tablet 0  . clindamycin (CLEOCIN) 300 MG capsule Take 300 mg by mouth 3 (three) times daily.    . cycloSPORINE (RESTASIS) 0.05 % ophthalmic emulsion 1 drop 2 (two) times daily.    . Multiple Vitamins-Minerals (MULTIVITAMIN) tablet Take 1 tablet by mouth daily.    . mupirocin cream (BACTROBAN) 2 % Apply 1 application topically 2 (two) times daily as needed. 30 g 2  . Omega 3 1200 MG CAPS Takes daily    . vitamin C (ASCORBIC ACID) 500 MG tablet Take 1,000 mg by mouth daily.    Marland Kitchen. albuterol (PROVENTIL HFA;VENTOLIN HFA) 108 (90 Base) MCG/ACT inhaler Inhale 2 puffs into the lungs every 6 (six) hours as needed for wheezing or shortness of breath. 1 Inhaler 0  . benzonatate (TESSALON) 200 MG capsule Take 1 capsule (200 mg total) by mouth 3 (three) times daily as needed for cough. 20 capsule 0  . doxycycline (VIBRA-TABS) 100 MG tablet Take 1 tablet (100 mg total) by mouth 2 (two) times daily. 20 tablet 0   No current facility-administered medications for this visit.     Allergies: Penicillins  Past Medical History:  Diagnosis Date  . Hepatitis A   . Hypertension     Past Surgical History:  Procedure Laterality Date  . ABDOMINAL HYSTERECTOMY    . CHOLECYSTECTOMY      Family History  Problem Relation Age of Onset  . Arthritis Mother   . Hypertension Mother   . Cancer Father        prost  . Alcohol abuse Brother     Social History    Tobacco Use  . Smoking status: Current Every Day Smoker    Packs/day: 1.50    Years: 40.00    Pack years: 60.00    Types: Cigarettes    Last attempt to quit: 01/30/2016    Years since quitting: 1.3  . Smokeless tobacco: Never Used  . Tobacco comment: patient states that she started smoking again and does not want to try to quit again at this time.  Substance Use Topics  . Alcohol use: Yes    Comment: occasional    Subjective:  3 week history of neck pain/ "nodules" in the back of neck; progressed into head cold/ coughing fits- cough is worse at night; + smoker- does not want to quit;  + fever when symptoms first developed; self-medicated with left over Clindamycin- has taken for 8 days with little relief; clindaycin was originally prescribed for dental issue and she didn't take at the time it was prescribed; dental issue has been treated;  Has been prescribed inhalers in the past due to her cough but opted not to use them- has history of allergic asthma;   Objective:  Vitals:   05/25/17 1038  BP: 138/74  Pulse: 74  Temp: 97.9 F (36.6 C)  TempSrc: Oral  SpO2: 98%  Weight: 150 lb 1.9 oz (68.1 kg)  Height: 5\' 6"  (1.676 m)    General: Well developed, well nourished, in no acute distress  Skin : Warm and dry.  Head: Normocephalic and atraumatic  Eyes: Sclera and conjunctiva clear; pupils round and reactive to light; extraocular movements intact  Ears: External normal; canals clear; tympanic membranes normal  Oropharynx: Pink, supple. No suspicious lesions  Neck: Supple without thyromegaly, bilateral posterior adenopathy  Lungs: Respirations unlabored; wheezing noted in upper lobes CVS exam: normal rate and regular rhythm.  Neurologic: Alert and oriented; speech intact; face symmetrical; moves all extremities well; CNII-XII intact without focal deficit  Assessment:  1. Lymphadenopathy of head and neck   2. Cough     Plan:  D/C Clindamycin; check CBC, CMP and update CXR  today; will start patient on Doxycycline 100 mg bid x 10 days, albuterol inhaler and Tessalon Perles; increase fluids, rest; work note given for remainder of the week; encouraged to quit smoking- patient does not want to quit; follow-up worse, no better.   No Follow-up on file.  Orders Placed This Encounter  Procedures  . DG Chest 2 View    Standing Status:   Future    Number of Occurrences:   1    Standing Expiration Date:   07/24/2018    Order Specific Question:   Reason for Exam (SYMPTOM  OR DIAGNOSIS REQUIRED)    Answer:   cough/ lymphadenopathy/ fever    Order Specific Question:   Preferred imaging location?    Answer:   [facility]    Order Specific Question:   Radiology Contrast Protocol - do NOT remove file path    Answer:   [email or web address]  . CBC with Differential/Platelet    Standing Status:   Future    Number of Occurrences:   1    Standing Expiration Date:   05/25/2018  . Comprehensive metabolic panel    Standing Status:   Future    Number of Occurrences:   1    Standing Expiration Date:   05/25/2018    Requested Prescriptions   Signed Prescriptions Disp Refills  . albuterol (PROVENTIL HFA;VENTOLIN HFA) 108 (90 Base) MCG/ACT inhaler 1 Inhaler 0    Sig: Inhale 2 puffs into the lungs every 6 (six) hours as needed for wheezing or shortness of breath.  . doxycycline (VIBRA-TABS) 100 MG tablet 20 tablet 0    Sig: Take 1 tablet (100 mg total) by mouth 2 (two) times daily.  . benzonatate (TESSALON) 200 MG capsule 20 capsule 0    Sig: Take 1 capsule (200 mg total) by mouth 3 (three) times daily as needed for cough.

## 2017-05-30 ENCOUNTER — Telehealth: Payer: Self-pay | Admitting: Internal Medicine

## 2017-05-30 DIAGNOSIS — Z0279 Encounter for issue of other medical certificate: Secondary | ICD-10-CM

## 2017-05-30 NOTE — Telephone Encounter (Signed)
Pt called in to follow up on FMLA paperwork. Pt says that [employer] faxed over paperwork on 05/25/17 and informed her that paperwork was not received back yet. Please advise.

## 2017-05-30 NOTE — Telephone Encounter (Signed)
LVM to inform patient I have received FMLA forms.

## 2017-05-30 NOTE — Telephone Encounter (Signed)
Forms have been completed &signed, Faxed to [employer], copy sent to scan &charged for.

## 2017-06-06 ENCOUNTER — Telehealth: Payer: Self-pay | Admitting: Internal Medicine

## 2017-06-06 NOTE — Telephone Encounter (Signed)
Spoke with pt to inform. Advised pt that an antibiotic will stay in your system 7-10 after completing the course. Pt is to call back if headache has not subsided in a few days to be reevaluated.

## 2017-06-06 NOTE — Telephone Encounter (Signed)
She already took some clindamycin and then doxy - I do not think she needs additional antibiotics.  She may need to be re-evaluated.

## 2017-06-06 NOTE — Telephone Encounter (Signed)
Copied from CRM [phone]. Topic: Quick Communication - See Telephone Encounter >> Jun 06, 2017  8:33 AM Clack, Princella PellegriniJessica D wrote: CRM for notification. See Telephone encounter for: Pt states that she finish the doxycycline (VIBRA-TABS) 100 MG tablet [[id number]][[id number]] on Friday but she still have the same headache. She states while taking the doxycycline the headache went away but came back this morning. Hollyann would like to know if she can get a refill on the doxycycline (VIBRA-TABS) 100 MG tablet [[id number]][[id number]].  06/06/17.

## 2017-06-06 NOTE — Telephone Encounter (Signed)
Pt saw Vernona RiegerLaura for this acute issue.

## 2017-07-29 ENCOUNTER — Other Ambulatory Visit: Payer: Self-pay | Admitting: Internal Medicine

## 2017-07-30 ENCOUNTER — Other Ambulatory Visit: Payer: Self-pay | Admitting: Internal Medicine

## 2017-08-03 ENCOUNTER — Other Ambulatory Visit: Payer: Self-pay | Admitting: Internal Medicine

## 2017-09-02 ENCOUNTER — Telehealth: Payer: Self-pay | Admitting: Internal Medicine

## 2017-09-02 ENCOUNTER — Other Ambulatory Visit: Payer: Self-pay

## 2017-09-02 MED ORDER — AMLODIPINE BESYLATE 5 MG PO TABS: 5.0000 mg | ORAL_TABLET | Freq: Every day | ORAL | 0 refills | Status: DC

## 2017-09-02 MED ORDER — BENAZEPRIL HCL 10 MG PO TABS: 10.0000 mg | ORAL_TABLET | Freq: Every day | ORAL | 0 refills | Status: DC

## 2017-09-02 NOTE — Telephone Encounter (Signed)
Copied from CRM [id number]. Topic: Quick Communication - Rx Refill/Question >> Sep 02, 2017 12:32 PM Rudi CocoLathan, Laiklynn Raczynski M, [place] wrote: Medication: benazepril (LOTENSIN) 10 MG tablet [[id number]][[id number]] pt. Also requesting prednisone due to having poison ivy. amLODipine (NORVASC) 5 MG tablet [[id number]][[id number]]  Has the patient contacted their pharmacy? no (Agent: If no, request that the patient contact the pharmacy for the refill.) Preferred Pharmacy (with phone number or street name): [id number] [place] [place] - [address] [facility] [address] [place] [address] Phone: [phone] Fax: [phone]   Agent: Please be advised that RX refills may take up to 3 business days. We ask that you follow-up with your pharmacy.

## 2017-09-06 NOTE — Telephone Encounter (Signed)
Spoke with pt to advise on refills. Advise that pt MUST make appt for further refills as she is over due for an office visit with Dr Lawerance BachBurns. Appt made.

## 2017-09-06 NOTE — Telephone Encounter (Signed)
Patient said she is unhappy because she always gets 90 day supply for her medications. She said that she went to the pharmacy to pick up her medicines yesterday and her BENAZEPRIL was for 30 days and she is not happy because that made her pay an extra $30 for it. She said that she would like for her scripts to be ongoing so she does not have to call in for her medications.

## 2017-09-08 ENCOUNTER — Telehealth: Payer: Self-pay | Admitting: Internal Medicine

## 2017-09-08 NOTE — Telephone Encounter (Signed)
Copied from CRM #[id number]. Topic: Quick Communication - Rx Refill/Question >> Sep 02, 2017 12:32 PM Lathan, Latoya M, NT wrote: Medication: benazepril (LOTENSIN) 10 MG tablet [[id number]] pt. Also requesting prednisone due to having poison ivy. amLODipine (NORVASC) 5 MG tablet [[id number]]  Has the patient contacted their pharmacy? no (Agent: If no, request that the patient contact the pharmacy for the refill.) Preferred Pharmacy (with phone number or street name): [id number] - [place], [place] - [address] AT [facility] [address] [place] [address] Phone: [phone] Fax: [phone]   Agent: Please be advised that RX refills may take up to 3 business days. We ask that you follow-up with your pharmacy. 

## 2017-09-08 NOTE — Telephone Encounter (Signed)
BP meds have already been sent in. Prednisone can not be sent in without being seen. LVM advising pt could call back and schedule appt to be seen if she would like.

## 2017-09-21 ENCOUNTER — Ambulatory Visit: Payer: PPO | Admitting: Internal Medicine

## 2017-09-23 DIAGNOSIS — I10 Essential (primary) hypertension: Secondary | ICD-10-CM | POA: Diagnosis not present

## 2017-09-23 DIAGNOSIS — Z6824 Body mass index (BMI) 24.0-24.9, adult: Secondary | ICD-10-CM | POA: Diagnosis not present

## 2017-09-23 DIAGNOSIS — F17200 Nicotine dependence, unspecified, uncomplicated: Secondary | ICD-10-CM | POA: Diagnosis not present

## 2017-09-28 ENCOUNTER — Other Ambulatory Visit: Payer: Self-pay | Admitting: Internal Medicine

## 2017-09-29 ENCOUNTER — Other Ambulatory Visit (HOSPITAL_COMMUNITY): Payer: Self-pay | Admitting: Internal Medicine

## 2017-09-29 DIAGNOSIS — Z780 Asymptomatic menopausal state: Secondary | ICD-10-CM

## 2017-10-03 ENCOUNTER — Ambulatory Visit (HOSPITAL_COMMUNITY)
Admission: RE | Admit: 2017-10-03 | Discharge: 2017-10-03 | Disposition: A | Discharge status: Home / Self Care | Payer: PPO | Source: Ambulatory Visit | Attending: Internal Medicine | Admitting: Internal Medicine

## 2017-10-03 DIAGNOSIS — M8588 Other specified disorders of bone density and structure, other site: Secondary | ICD-10-CM | POA: Insufficient documentation

## 2017-10-03 DIAGNOSIS — Z780 Asymptomatic menopausal state: Secondary | ICD-10-CM | POA: Diagnosis not present

## 2017-10-03 DIAGNOSIS — M8589 Other specified disorders of bone density and structure, multiple sites: Secondary | ICD-10-CM | POA: Diagnosis not present

## 2017-10-03 DIAGNOSIS — M85851 Other specified disorders of bone density and structure, right thigh: Secondary | ICD-10-CM | POA: Diagnosis not present

## 2017-10-07 DIAGNOSIS — Z6824 Body mass index (BMI) 24.0-24.9, adult: Secondary | ICD-10-CM | POA: Diagnosis not present

## 2017-10-07 DIAGNOSIS — I499 Cardiac arrhythmia, unspecified: Secondary | ICD-10-CM | POA: Diagnosis not present

## 2017-10-07 DIAGNOSIS — I10 Essential (primary) hypertension: Secondary | ICD-10-CM | POA: Diagnosis not present

## 2017-10-07 DIAGNOSIS — M8580 Other specified disorders of bone density and structure, unspecified site: Secondary | ICD-10-CM | POA: Diagnosis not present

## 2017-10-07 DIAGNOSIS — E782 Mixed hyperlipidemia: Secondary | ICD-10-CM | POA: Diagnosis not present

## 2017-10-07 DIAGNOSIS — Z0000 Encounter for general adult medical examination without abnormal findings: Secondary | ICD-10-CM | POA: Diagnosis not present

## 2017-10-07 DIAGNOSIS — F17200 Nicotine dependence, unspecified, uncomplicated: Secondary | ICD-10-CM | POA: Diagnosis not present

## 2017-12-02 ENCOUNTER — Other Ambulatory Visit: Payer: Self-pay | Admitting: Internal Medicine

## 2018-01-30 DIAGNOSIS — Z6824 Body mass index (BMI) 24.0-24.9, adult: Secondary | ICD-10-CM | POA: Diagnosis not present

## 2018-01-30 DIAGNOSIS — R0602 Shortness of breath: Secondary | ICD-10-CM | POA: Diagnosis not present

## 2018-01-30 DIAGNOSIS — R062 Wheezing: Secondary | ICD-10-CM | POA: Diagnosis not present

## 2018-01-30 DIAGNOSIS — J0100 Acute maxillary sinusitis, unspecified: Secondary | ICD-10-CM | POA: Diagnosis not present

## 2018-01-30 DIAGNOSIS — J060 Acute laryngopharyngitis: Secondary | ICD-10-CM | POA: Diagnosis not present

## 2018-04-06 DIAGNOSIS — F17200 Nicotine dependence, unspecified, uncomplicated: Secondary | ICD-10-CM | POA: Diagnosis not present

## 2018-04-06 DIAGNOSIS — M8580 Other specified disorders of bone density and structure, unspecified site: Secondary | ICD-10-CM | POA: Diagnosis not present

## 2018-04-06 DIAGNOSIS — Z0000 Encounter for general adult medical examination without abnormal findings: Secondary | ICD-10-CM | POA: Diagnosis not present

## 2018-04-06 DIAGNOSIS — R062 Wheezing: Secondary | ICD-10-CM | POA: Diagnosis not present

## 2018-04-06 DIAGNOSIS — R0602 Shortness of breath: Secondary | ICD-10-CM | POA: Diagnosis not present

## 2018-04-06 DIAGNOSIS — I499 Cardiac arrhythmia, unspecified: Secondary | ICD-10-CM | POA: Diagnosis not present

## 2018-04-06 DIAGNOSIS — E782 Mixed hyperlipidemia: Secondary | ICD-10-CM | POA: Diagnosis not present

## 2018-04-06 DIAGNOSIS — J0100 Acute maxillary sinusitis, unspecified: Secondary | ICD-10-CM | POA: Diagnosis not present

## 2018-04-06 DIAGNOSIS — Z6824 Body mass index (BMI) 24.0-24.9, adult: Secondary | ICD-10-CM | POA: Diagnosis not present

## 2018-04-06 DIAGNOSIS — I10 Essential (primary) hypertension: Secondary | ICD-10-CM | POA: Diagnosis not present

## 2018-04-06 DIAGNOSIS — J060 Acute laryngopharyngitis: Secondary | ICD-10-CM | POA: Diagnosis not present

## 2018-04-10 DIAGNOSIS — H524 Presbyopia: Secondary | ICD-10-CM | POA: Diagnosis not present

## 2018-04-10 DIAGNOSIS — I10 Essential (primary) hypertension: Secondary | ICD-10-CM | POA: Diagnosis not present

## 2018-04-10 DIAGNOSIS — H04123 Dry eye syndrome of bilateral lacrimal glands: Secondary | ICD-10-CM | POA: Diagnosis not present

## 2018-04-10 DIAGNOSIS — H2513 Age-related nuclear cataract, bilateral: Secondary | ICD-10-CM | POA: Diagnosis not present

## 2018-04-11 DIAGNOSIS — E782 Mixed hyperlipidemia: Secondary | ICD-10-CM | POA: Diagnosis not present

## 2018-04-11 DIAGNOSIS — I10 Essential (primary) hypertension: Secondary | ICD-10-CM | POA: Diagnosis not present

## 2018-04-11 DIAGNOSIS — F17200 Nicotine dependence, unspecified, uncomplicated: Secondary | ICD-10-CM | POA: Diagnosis not present

## 2018-09-27 DIAGNOSIS — Z0000 Encounter for general adult medical examination without abnormal findings: Secondary | ICD-10-CM | POA: Diagnosis not present

## 2018-10-10 DIAGNOSIS — I10 Essential (primary) hypertension: Secondary | ICD-10-CM | POA: Diagnosis not present

## 2018-10-10 DIAGNOSIS — E782 Mixed hyperlipidemia: Secondary | ICD-10-CM | POA: Diagnosis not present

## 2018-10-13 DIAGNOSIS — E782 Mixed hyperlipidemia: Secondary | ICD-10-CM | POA: Diagnosis not present

## 2018-10-13 DIAGNOSIS — Z0001 Encounter for general adult medical examination with abnormal findings: Secondary | ICD-10-CM | POA: Diagnosis not present

## 2018-10-13 DIAGNOSIS — I10 Essential (primary) hypertension: Secondary | ICD-10-CM | POA: Diagnosis not present

## 2018-10-13 DIAGNOSIS — F17210 Nicotine dependence, cigarettes, uncomplicated: Secondary | ICD-10-CM | POA: Diagnosis not present

## 2018-12-04 DIAGNOSIS — N390 Urinary tract infection, site not specified: Secondary | ICD-10-CM | POA: Diagnosis not present

## 2018-12-18 ENCOUNTER — Other Ambulatory Visit: Payer: Self-pay

## 2019-02-19 DIAGNOSIS — K625 Hemorrhage of anus and rectum: Secondary | ICD-10-CM | POA: Diagnosis not present

## 2019-02-19 DIAGNOSIS — R10819 Abdominal tenderness, unspecified site: Secondary | ICD-10-CM | POA: Diagnosis not present

## 2019-02-19 IMAGING — DX DG CHEST 2V
2 series · 2 of 2 positions shown · non-contrast
Comparison: None.

CLINICAL DATA: Cough and fever

EXAM:
CHEST  2 VIEW

[chest pa]
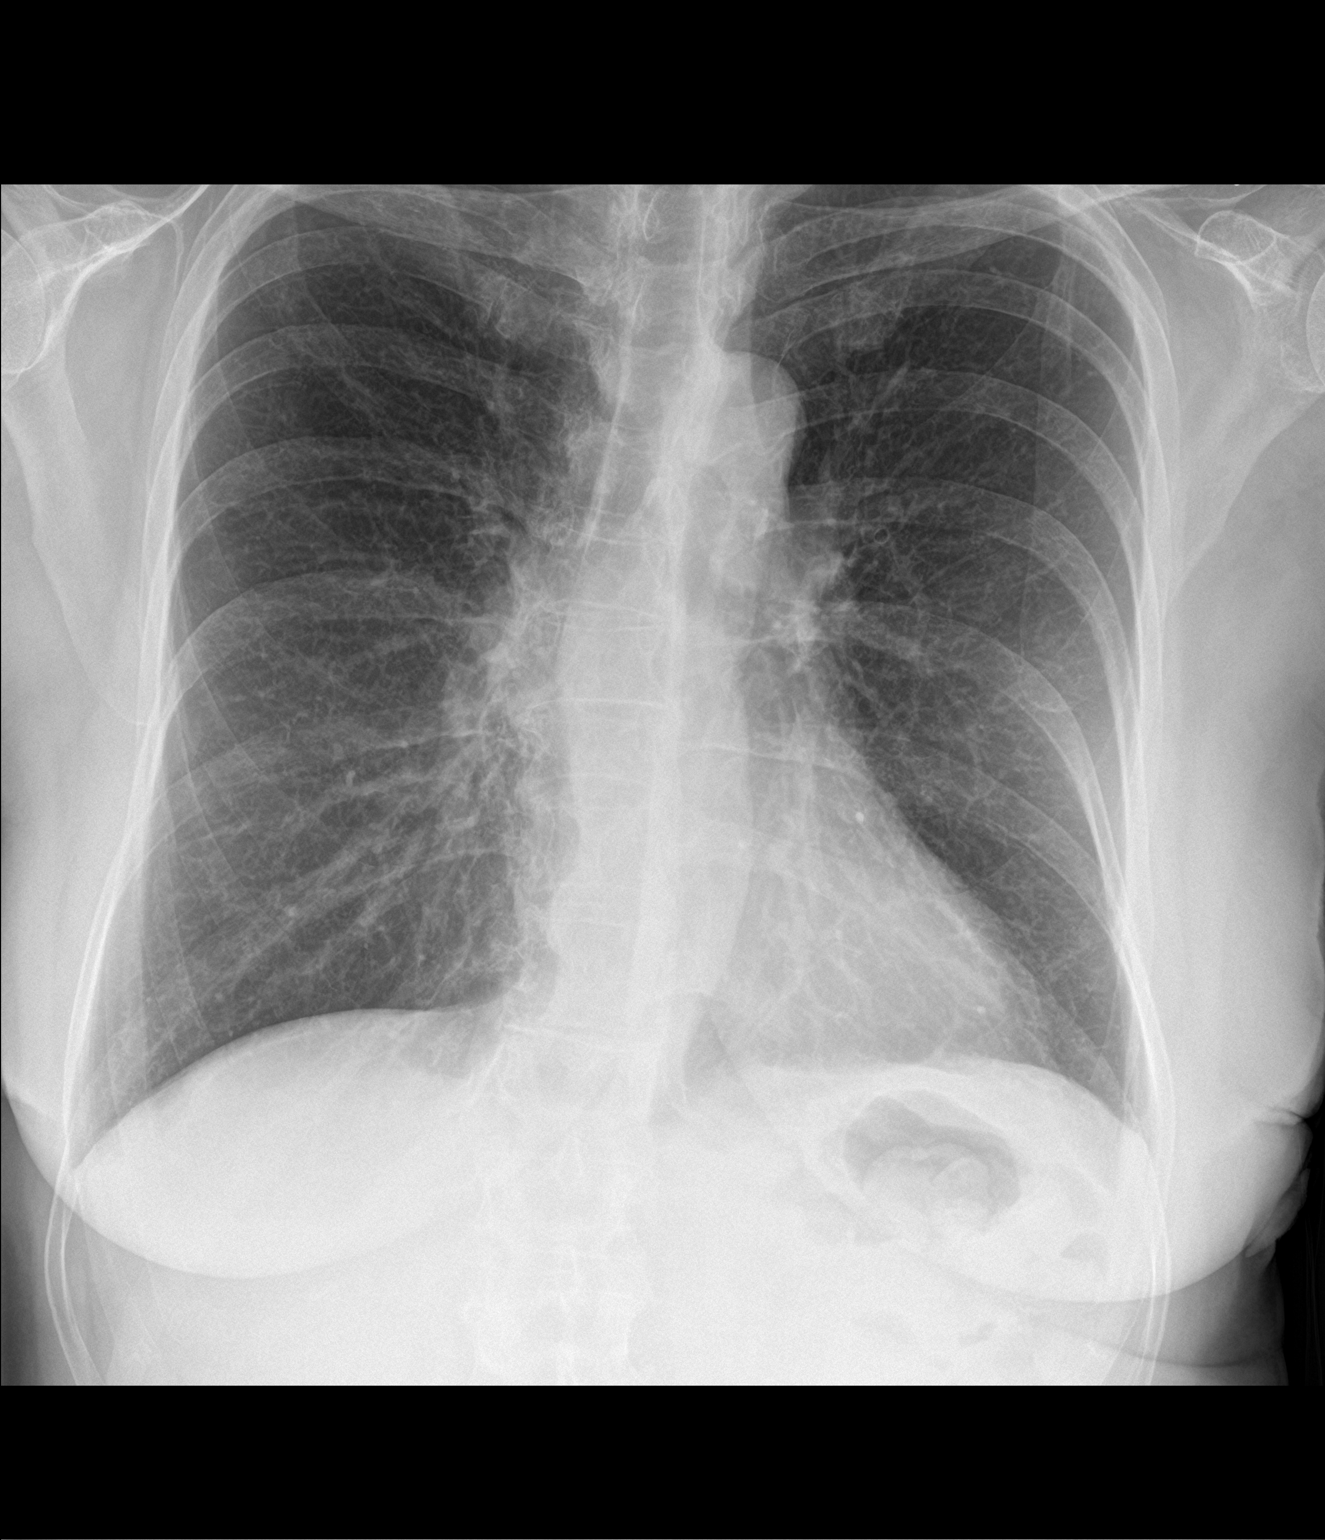

[chest lat]
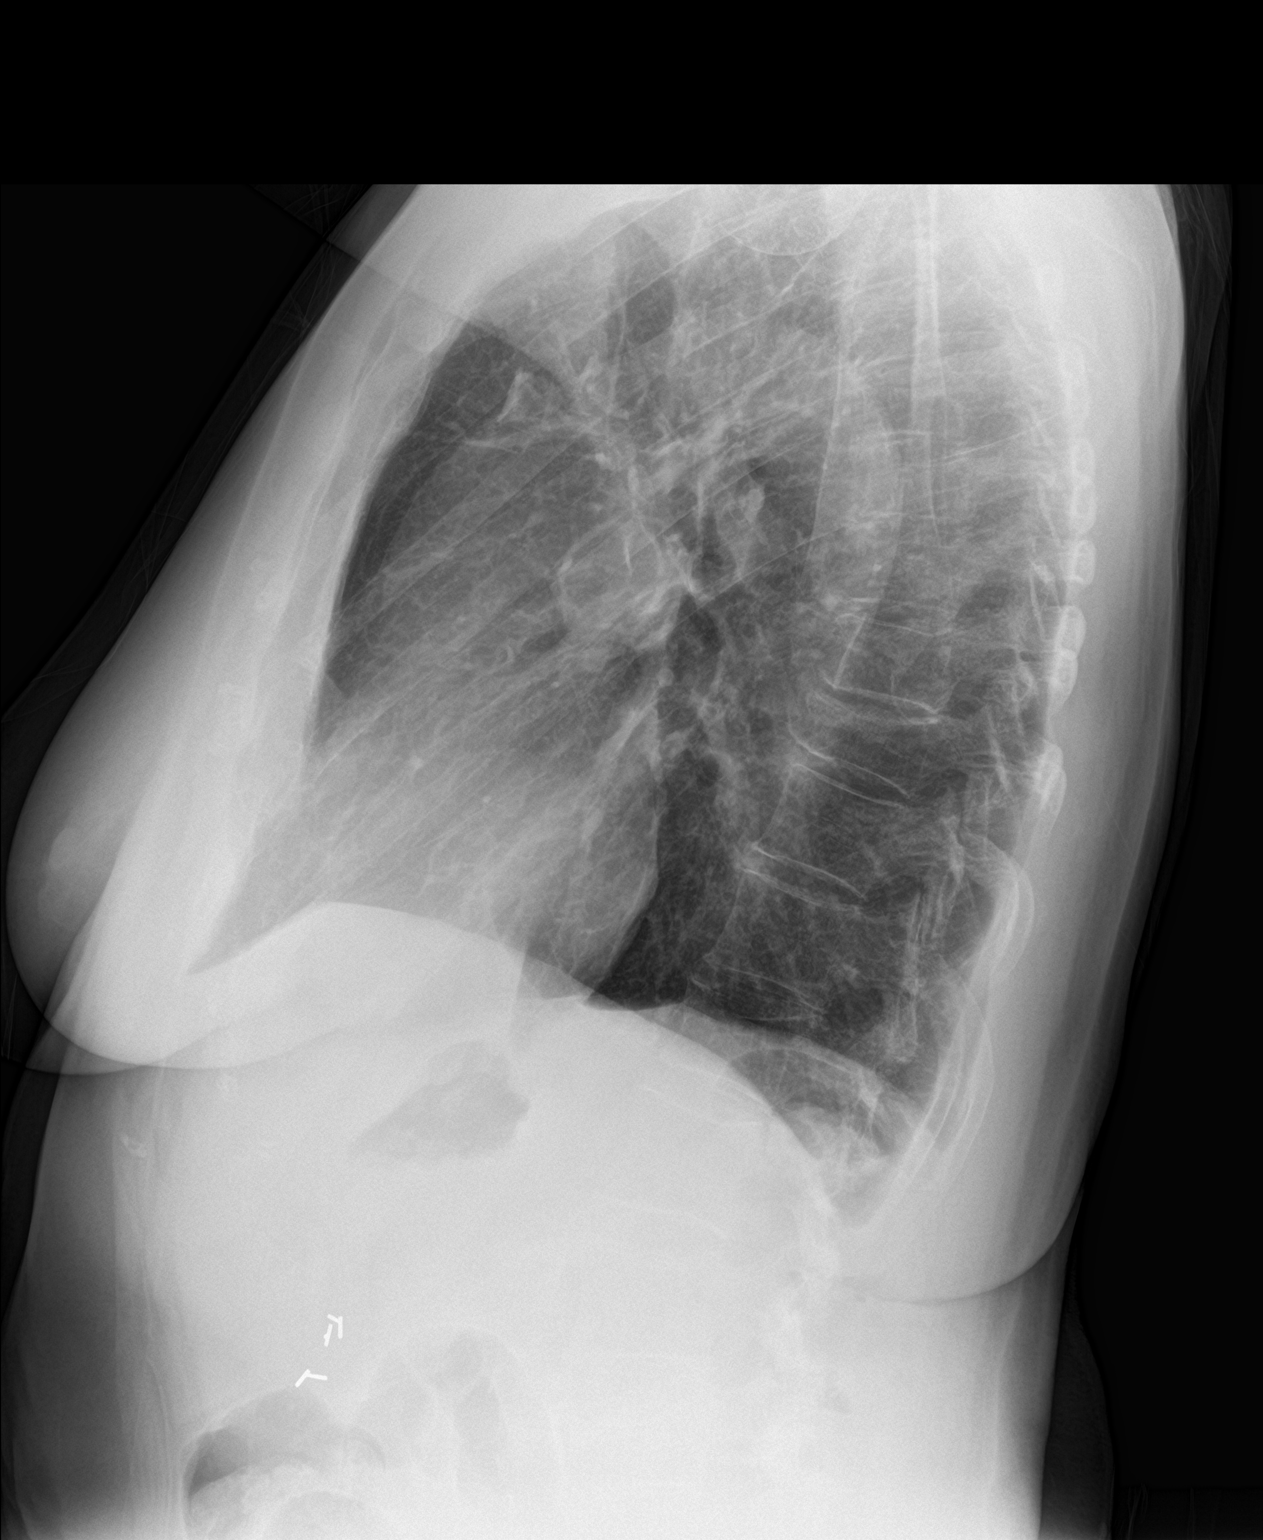

[2 of 2 positions shown; findings below may reference images not displayed]

FINDINGS: There is no edema or consolidation. The heart size and pulmonary
vascularity are normal. No adenopathy. There is aortic
atherosclerosis. There is anterior wedging of a midthoracic
vertebral body. There is thoracolumbar dextroscoliosis.
IMPRESSION: No edema or consolidation.  There is aortic atherosclerosis.

Aortic Atherosclerosis (L5ES2-XBP.P).

## 2019-02-22 ENCOUNTER — Ambulatory Visit: Payer: PPO | Admitting: Nurse Practitioner

## 2019-03-07 ENCOUNTER — Ambulatory Visit: Payer: PPO | Admitting: Gastroenterology

## 2019-04-11 ENCOUNTER — Other Ambulatory Visit: Payer: Self-pay

## 2019-04-16 DIAGNOSIS — L989 Disorder of the skin and subcutaneous tissue, unspecified: Secondary | ICD-10-CM | POA: Diagnosis not present

## 2019-04-16 DIAGNOSIS — R195 Other fecal abnormalities: Secondary | ICD-10-CM | POA: Diagnosis not present

## 2019-04-16 DIAGNOSIS — I10 Essential (primary) hypertension: Secondary | ICD-10-CM | POA: Diagnosis not present

## 2019-06-20 DIAGNOSIS — E7849 Other hyperlipidemia: Secondary | ICD-10-CM | POA: Diagnosis not present

## 2019-06-20 DIAGNOSIS — I10 Essential (primary) hypertension: Secondary | ICD-10-CM | POA: Diagnosis not present

## 2019-06-20 DIAGNOSIS — F17210 Nicotine dependence, cigarettes, uncomplicated: Secondary | ICD-10-CM | POA: Diagnosis not present

## 2019-09-13 DIAGNOSIS — F17210 Nicotine dependence, cigarettes, uncomplicated: Secondary | ICD-10-CM | POA: Diagnosis not present

## 2019-09-13 DIAGNOSIS — I10 Essential (primary) hypertension: Secondary | ICD-10-CM | POA: Diagnosis not present

## 2019-09-13 DIAGNOSIS — E7849 Other hyperlipidemia: Secondary | ICD-10-CM | POA: Diagnosis not present

## 2019-10-03 DIAGNOSIS — K625 Hemorrhage of anus and rectum: Secondary | ICD-10-CM | POA: Diagnosis not present

## 2019-10-03 DIAGNOSIS — F17211 Nicotine dependence, cigarettes, in remission: Secondary | ICD-10-CM | POA: Diagnosis not present

## 2019-10-03 DIAGNOSIS — F17210 Nicotine dependence, cigarettes, uncomplicated: Secondary | ICD-10-CM | POA: Diagnosis not present

## 2019-10-03 DIAGNOSIS — I499 Cardiac arrhythmia, unspecified: Secondary | ICD-10-CM | POA: Diagnosis not present

## 2019-10-03 DIAGNOSIS — J060 Acute laryngopharyngitis: Secondary | ICD-10-CM | POA: Diagnosis not present

## 2019-10-03 DIAGNOSIS — M8580 Other specified disorders of bone density and structure, unspecified site: Secondary | ICD-10-CM | POA: Diagnosis not present

## 2019-10-03 DIAGNOSIS — J0100 Acute maxillary sinusitis, unspecified: Secondary | ICD-10-CM | POA: Diagnosis not present

## 2019-10-03 DIAGNOSIS — E7849 Other hyperlipidemia: Secondary | ICD-10-CM | POA: Diagnosis not present

## 2019-10-03 DIAGNOSIS — I10 Essential (primary) hypertension: Secondary | ICD-10-CM | POA: Diagnosis not present

## 2019-10-03 DIAGNOSIS — F17200 Nicotine dependence, unspecified, uncomplicated: Secondary | ICD-10-CM | POA: Diagnosis not present

## 2019-10-03 DIAGNOSIS — E782 Mixed hyperlipidemia: Secondary | ICD-10-CM | POA: Diagnosis not present

## 2019-10-03 DIAGNOSIS — L989 Disorder of the skin and subcutaneous tissue, unspecified: Secondary | ICD-10-CM | POA: Diagnosis not present

## 2019-10-08 ENCOUNTER — Other Ambulatory Visit (HOSPITAL_COMMUNITY): Payer: Self-pay | Admitting: Internal Medicine

## 2019-10-08 DIAGNOSIS — I10 Essential (primary) hypertension: Secondary | ICD-10-CM | POA: Diagnosis not present

## 2019-10-08 DIAGNOSIS — F17210 Nicotine dependence, cigarettes, uncomplicated: Secondary | ICD-10-CM | POA: Diagnosis not present

## 2019-10-08 DIAGNOSIS — E7849 Other hyperlipidemia: Secondary | ICD-10-CM | POA: Diagnosis not present

## 2019-10-08 DIAGNOSIS — E782 Mixed hyperlipidemia: Secondary | ICD-10-CM | POA: Diagnosis not present

## 2019-10-08 DIAGNOSIS — Z1231 Encounter for screening mammogram for malignant neoplasm of breast: Secondary | ICD-10-CM

## 2019-10-08 DIAGNOSIS — Z0001 Encounter for general adult medical examination with abnormal findings: Secondary | ICD-10-CM | POA: Diagnosis not present

## 2019-10-18 ENCOUNTER — Encounter (INDEPENDENT_AMBULATORY_CARE_PROVIDER_SITE_OTHER): Payer: Self-pay | Admitting: Gastroenterology

## 2019-10-24 ENCOUNTER — Ambulatory Visit (HOSPITAL_COMMUNITY): Payer: PPO

## 2019-11-26 ENCOUNTER — Ambulatory Visit (INDEPENDENT_AMBULATORY_CARE_PROVIDER_SITE_OTHER): Payer: PPO | Admitting: Gastroenterology

## 2019-12-14 DIAGNOSIS — F17210 Nicotine dependence, cigarettes, uncomplicated: Secondary | ICD-10-CM | POA: Diagnosis not present

## 2019-12-14 DIAGNOSIS — E7849 Other hyperlipidemia: Secondary | ICD-10-CM | POA: Diagnosis not present

## 2019-12-14 DIAGNOSIS — I10 Essential (primary) hypertension: Secondary | ICD-10-CM | POA: Diagnosis not present

## 2019-12-14 DIAGNOSIS — Z0001 Encounter for general adult medical examination with abnormal findings: Secondary | ICD-10-CM | POA: Diagnosis not present

## 2020-02-13 DIAGNOSIS — I10 Essential (primary) hypertension: Secondary | ICD-10-CM | POA: Diagnosis not present

## 2020-02-13 DIAGNOSIS — E7849 Other hyperlipidemia: Secondary | ICD-10-CM | POA: Diagnosis not present

## 2020-02-13 DIAGNOSIS — Z0001 Encounter for general adult medical examination with abnormal findings: Secondary | ICD-10-CM | POA: Diagnosis not present

## 2020-02-13 DIAGNOSIS — F17210 Nicotine dependence, cigarettes, uncomplicated: Secondary | ICD-10-CM | POA: Diagnosis not present

## 2020-02-25 DIAGNOSIS — Z0001 Encounter for general adult medical examination with abnormal findings: Secondary | ICD-10-CM | POA: Diagnosis not present

## 2020-02-25 DIAGNOSIS — I10 Essential (primary) hypertension: Secondary | ICD-10-CM | POA: Diagnosis not present

## 2020-02-25 DIAGNOSIS — F17210 Nicotine dependence, cigarettes, uncomplicated: Secondary | ICD-10-CM | POA: Diagnosis not present

## 2020-02-25 DIAGNOSIS — E7849 Other hyperlipidemia: Secondary | ICD-10-CM | POA: Diagnosis not present

## 2020-03-18 DIAGNOSIS — N390 Urinary tract infection, site not specified: Secondary | ICD-10-CM | POA: Diagnosis not present

## 2020-05-16 DIAGNOSIS — E7849 Other hyperlipidemia: Secondary | ICD-10-CM | POA: Diagnosis not present

## 2020-05-16 DIAGNOSIS — F17210 Nicotine dependence, cigarettes, uncomplicated: Secondary | ICD-10-CM | POA: Diagnosis not present

## 2020-05-16 DIAGNOSIS — Z0001 Encounter for general adult medical examination with abnormal findings: Secondary | ICD-10-CM | POA: Diagnosis not present

## 2020-05-16 DIAGNOSIS — I10 Essential (primary) hypertension: Secondary | ICD-10-CM | POA: Diagnosis not present

## 2020-06-16 DIAGNOSIS — H2513 Age-related nuclear cataract, bilateral: Secondary | ICD-10-CM | POA: Diagnosis not present

## 2020-06-16 DIAGNOSIS — H524 Presbyopia: Secondary | ICD-10-CM | POA: Diagnosis not present

## 2020-06-16 DIAGNOSIS — I10 Essential (primary) hypertension: Secondary | ICD-10-CM | POA: Diagnosis not present

## 2020-06-16 DIAGNOSIS — H04123 Dry eye syndrome of bilateral lacrimal glands: Secondary | ICD-10-CM | POA: Diagnosis not present

## 2020-06-30 DIAGNOSIS — H2513 Age-related nuclear cataract, bilateral: Secondary | ICD-10-CM | POA: Diagnosis not present

## 2020-06-30 DIAGNOSIS — H2512 Age-related nuclear cataract, left eye: Secondary | ICD-10-CM | POA: Diagnosis not present

## 2020-06-30 DIAGNOSIS — H04123 Dry eye syndrome of bilateral lacrimal glands: Secondary | ICD-10-CM | POA: Diagnosis not present

## 2020-07-17 DIAGNOSIS — H2512 Age-related nuclear cataract, left eye: Secondary | ICD-10-CM | POA: Diagnosis not present

## 2020-08-07 DIAGNOSIS — H2511 Age-related nuclear cataract, right eye: Secondary | ICD-10-CM | POA: Diagnosis not present

## 2020-08-07 DIAGNOSIS — H25011 Cortical age-related cataract, right eye: Secondary | ICD-10-CM | POA: Diagnosis not present

## 2020-08-07 DIAGNOSIS — H25041 Posterior subcapsular polar age-related cataract, right eye: Secondary | ICD-10-CM | POA: Diagnosis not present

## 2020-08-13 DIAGNOSIS — F17210 Nicotine dependence, cigarettes, uncomplicated: Secondary | ICD-10-CM | POA: Diagnosis not present

## 2020-08-13 DIAGNOSIS — I10 Essential (primary) hypertension: Secondary | ICD-10-CM | POA: Diagnosis not present

## 2020-08-13 DIAGNOSIS — E782 Mixed hyperlipidemia: Secondary | ICD-10-CM | POA: Diagnosis not present

## 2020-09-14 DIAGNOSIS — I10 Essential (primary) hypertension: Secondary | ICD-10-CM | POA: Diagnosis not present

## 2020-09-14 DIAGNOSIS — E7849 Other hyperlipidemia: Secondary | ICD-10-CM | POA: Diagnosis not present

## 2020-10-10 DIAGNOSIS — I10 Essential (primary) hypertension: Secondary | ICD-10-CM | POA: Diagnosis not present

## 2020-10-15 DIAGNOSIS — I10 Essential (primary) hypertension: Secondary | ICD-10-CM | POA: Diagnosis not present

## 2020-10-15 DIAGNOSIS — Z0001 Encounter for general adult medical examination with abnormal findings: Secondary | ICD-10-CM | POA: Diagnosis not present

## 2020-10-15 DIAGNOSIS — E782 Mixed hyperlipidemia: Secondary | ICD-10-CM | POA: Diagnosis not present

## 2020-10-15 DIAGNOSIS — F17200 Nicotine dependence, unspecified, uncomplicated: Secondary | ICD-10-CM | POA: Diagnosis not present

## 2020-10-15 DIAGNOSIS — L853 Xerosis cutis: Secondary | ICD-10-CM | POA: Diagnosis not present

## 2020-11-13 DIAGNOSIS — I10 Essential (primary) hypertension: Secondary | ICD-10-CM | POA: Diagnosis not present

## 2020-11-13 DIAGNOSIS — E7849 Other hyperlipidemia: Secondary | ICD-10-CM | POA: Diagnosis not present

## 2020-11-18 DIAGNOSIS — H00025 Hordeolum internum left lower eyelid: Secondary | ICD-10-CM | POA: Diagnosis not present

## 2021-03-16 DIAGNOSIS — E7849 Other hyperlipidemia: Secondary | ICD-10-CM | POA: Diagnosis not present

## 2021-03-16 DIAGNOSIS — I10 Essential (primary) hypertension: Secondary | ICD-10-CM | POA: Diagnosis not present

## 2021-05-19 DIAGNOSIS — I10 Essential (primary) hypertension: Secondary | ICD-10-CM | POA: Diagnosis not present

## 2021-08-19 DIAGNOSIS — M25532 Pain in left wrist: Secondary | ICD-10-CM | POA: Diagnosis not present

## 2021-08-24 ENCOUNTER — Emergency Department (HOSPITAL_COMMUNITY): Payer: PPO

## 2021-08-24 ENCOUNTER — Encounter (HOSPITAL_COMMUNITY): Payer: Self-pay | Admitting: *Deleted

## 2021-08-24 ENCOUNTER — Observation Stay (HOSPITAL_COMMUNITY)
Admission: EM | Admit: 2021-08-24 | Discharge: 2021-08-25 | Disposition: A | Discharge status: Home / Self Care | Payer: PPO | Attending: Internal Medicine | Admitting: Internal Medicine

## 2021-08-24 ENCOUNTER — Other Ambulatory Visit: Payer: Self-pay

## 2021-08-24 DIAGNOSIS — R739 Hyperglycemia, unspecified: Secondary | ICD-10-CM | POA: Diagnosis present

## 2021-08-24 DIAGNOSIS — I10 Essential (primary) hypertension: Secondary | ICD-10-CM | POA: Diagnosis present

## 2021-08-24 DIAGNOSIS — F17210 Nicotine dependence, cigarettes, uncomplicated: Secondary | ICD-10-CM | POA: Diagnosis not present

## 2021-08-24 DIAGNOSIS — R531 Weakness: Secondary | ICD-10-CM | POA: Diagnosis present

## 2021-08-24 DIAGNOSIS — I639 Cerebral infarction, unspecified: Secondary | ICD-10-CM | POA: Diagnosis not present

## 2021-08-24 DIAGNOSIS — Z79899 Other long term (current) drug therapy: Secondary | ICD-10-CM | POA: Diagnosis not present

## 2021-08-24 DIAGNOSIS — Z720 Tobacco use: Secondary | ICD-10-CM

## 2021-08-24 DIAGNOSIS — I6523 Occlusion and stenosis of bilateral carotid arteries: Secondary | ICD-10-CM | POA: Diagnosis not present

## 2021-08-24 DIAGNOSIS — R2689 Other abnormalities of gait and mobility: Secondary | ICD-10-CM | POA: Insufficient documentation

## 2021-08-24 DIAGNOSIS — E785 Hyperlipidemia, unspecified: Secondary | ICD-10-CM | POA: Diagnosis not present

## 2021-08-24 DIAGNOSIS — R29818 Other symptoms and signs involving the nervous system: Secondary | ICD-10-CM | POA: Diagnosis not present

## 2021-08-24 DIAGNOSIS — M47812 Spondylosis without myelopathy or radiculopathy, cervical region: Secondary | ICD-10-CM | POA: Diagnosis not present

## 2021-08-24 LAB — I-STAT CHEM 8, ED
BUN: 18 mg/dL (ref 8–23)
Calcium, Ion: 1.21 mmol/L (ref 1.15–1.40)
Chloride: 105 mmol/L (ref 98–111)
Creatinine, Ser: 0.7 mg/dL (ref 0.44–1.00)
Glucose, Bld: 111 mg/dL — ABNORMAL HIGH (ref 70–99)
HCT: 43 % (ref 36.0–46.0)
Hemoglobin: 14.6 g/dL (ref 12.0–15.0)
Potassium: 3.8 mmol/L (ref 3.5–5.1)
Sodium: 141 mmol/L (ref 135–145)
TCO2: 27 mmol/L (ref 22–32)

## 2021-08-24 LAB — CBC WITH DIFFERENTIAL/PLATELET
Abs Immature Granulocytes: 0.01 10*3/uL (ref 0.00–0.07)
Basophils Absolute: 0 10*3/uL (ref 0.0–0.1)
Basophils Relative: 1 %
Eosinophils Absolute: 0.1 10*3/uL (ref 0.0–0.5)
Eosinophils Relative: 1 %
HCT: 43 % (ref 36.0–46.0)
Hemoglobin: 14.7 g/dL (ref 12.0–15.0)
Immature Granulocytes: 0 %
Lymphocytes Relative: 41 %
Lymphs Abs: 2.8 10*3/uL (ref 0.7–4.0)
MCH: 33 pg (ref 26.0–34.0)
MCHC: 34.2 g/dL (ref 30.0–36.0)
MCV: 96.6 fL (ref 80.0–100.0)
Monocytes Absolute: 0.6 10*3/uL (ref 0.1–1.0)
Monocytes Relative: 9 %
Neutro Abs: 3.3 10*3/uL (ref 1.7–7.7)
Neutrophils Relative %: 48 %
Platelets: 283 10*3/uL (ref 150–400)
RBC: 4.45 MIL/uL (ref 3.87–5.11)
RDW: 11.9 % (ref 11.5–15.5)
WBC: 6.8 10*3/uL (ref 4.0–10.5)
nRBC: 0 % (ref 0.0–0.2)

## 2021-08-24 LAB — CBG MONITORING, ED: Glucose-Capillary: 103 mg/dL — ABNORMAL HIGH (ref 70–99)

## 2021-08-24 LAB — HEPATIC FUNCTION PANEL
ALT: 14 U/L (ref 0–44)
AST: 23 U/L (ref 15–41)
Albumin: 3.9 g/dL (ref 3.5–5.0)
Alkaline Phosphatase: 71 U/L (ref 38–126)
Bilirubin, Direct: <0.1 mg/dL (ref 0.0–0.2)
Total Bilirubin: 0.6 mg/dL (ref 0.3–1.2)
Total Protein: 7.9 g/dL (ref 6.5–8.1)

## 2021-08-24 MED ORDER — SODIUM CHLORIDE 0.9 % IV SOLN: INTRAVENOUS | Status: DC

## 2021-08-24 MED ORDER — AMLODIPINE BESYLATE 5 MG PO TABS
5.0000 mg | ORAL_TABLET | Freq: Every day | ORAL | Status: DC
  Administered 2021-08-24 – 2021-08-25 (×2): 5 mg via ORAL
  Filled 2021-08-24 (×2): qty 1

## 2021-08-24 MED ORDER — ACETAMINOPHEN 650 MG RE SUPP: 650.0000 mg | RECTAL | Status: DC | PRN

## 2021-08-24 MED ORDER — STROKE: EARLY STAGES OF RECOVERY BOOK
Freq: Once | Status: AC
  Filled 2021-08-24: qty 1

## 2021-08-24 MED ORDER — ACETAMINOPHEN 160 MG/5ML PO SOLN: 650.0000 mg | ORAL | Status: DC | PRN

## 2021-08-24 MED ORDER — ACETAMINOPHEN 325 MG PO TABS
650.0000 mg | ORAL_TABLET | ORAL | Status: DC | PRN
  Administered 2021-08-25: 650 mg via ORAL
  Filled 2021-08-24: qty 2

## 2021-08-24 MED ORDER — BENAZEPRIL HCL 10 MG PO TABS
10.0000 mg | ORAL_TABLET | Freq: Every day | ORAL | Status: DC
  Administered 2021-08-24 – 2021-08-25 (×2): 10 mg via ORAL
  Filled 2021-08-24 (×2): qty 1

## 2021-08-24 MED ORDER — CYCLOSPORINE 0.05 % OP EMUL
1.0000 [drp] | Freq: Two times a day (BID) | OPHTHALMIC | Status: DC
  Administered 2021-08-24 – 2021-08-25 (×2): 1 [drp] via OPHTHALMIC
  Filled 2021-08-24 (×2): qty 30

## 2021-08-24 MED ORDER — ASPIRIN 81 MG PO CHEW: 81.0000 mg | CHEWABLE_TABLET | Freq: Every day | ORAL | Status: DC

## 2021-08-24 MED ORDER — IOHEXOL 350 MG/ML SOLN
100.0000 mL | Freq: Once | INTRAVENOUS | Status: AC | PRN
  Administered 2021-08-24: 50 mL via INTRAVENOUS

## 2021-08-24 MED ORDER — ADULT MULTIVITAMIN W/MINERALS CH
1.0000 | ORAL_TABLET | Freq: Every day | ORAL | Status: DC
  Administered 2021-08-24 – 2021-08-25 (×2): 1 via ORAL
  Filled 2021-08-24 (×3): qty 1

## 2021-08-24 MED ORDER — SENNOSIDES-DOCUSATE SODIUM 8.6-50 MG PO TABS: 1.0000 | ORAL_TABLET | Freq: Every evening | ORAL | Status: DC | PRN

## 2021-08-24 MED ORDER — CLOPIDOGREL BISULFATE 75 MG PO TABS: 75.0000 mg | ORAL_TABLET | Freq: Every day | ORAL | Status: DC

## 2021-08-24 MED ORDER — NICOTINE 14 MG/24HR TD PT24
14.0000 mg | MEDICATED_PATCH | Freq: Once | TRANSDERMAL | Status: AC
Start: 2021-08-24 — End: 2021-08-25
  Administered 2021-08-24: 14 mg via TRANSDERMAL
  Filled 2021-08-24: qty 1

## 2021-08-24 NOTE — H&P (Signed)
? ? ?History and Physical:  ? ? ?Holly Gilmore  ? ?WNI:[id number] DOB: May 29, 1945 DOA: 08/24/2021 ? ?Referring MD/provider: Dr. Wilkie Aye ?PCP: Benita Stabile, MD  ? ?Patient coming from: Home ? ?Chief Complaint: Right-sided dis-coordination. ? ?History of Present Illness:  ? ?Holly Gilmore is an 76 y.o. female with a PMH of HTN and a 2 pack a day active smoking habit who presented to the ED with a chief complaint of right sided weakness associated with an uncoordinated gait upon awakening this morning. She was last known to be asymptomatic at around 11 pm the night before.  The patient's son also reported some changes in normal speech, but was conversant upon my evaluation.  Holly Gilmore indicates she has also had high cholesterol in past, but has been reluctant to take statins, preferring dietary changes.  She also stopped taking daily low dose ASA 4-5 years ago. ? 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 She was given ASA/Plavix and referred for further work-up. Neurology has been consulted. ?  ? ?ROS:  ? ?Review of Systems  ?Constitutional: Negative.   ?HENT: Negative.    ?Eyes: Negative.   ?Respiratory: Negative.    ?Cardiovascular: Negative.   ?Gastrointestinal: Negative.   ?Genitourinary: Negative.   ?Musculoskeletal:  Positive for neck pain.  ?Skin: Negative.   ?Neurological:  Positive for focal weakness.  ?Endo/Heme/Allergies: Negative.   ?Psychiatric/Behavioral: Negative.    ? ?Past Medical History:  ? ?Past Medical History:  ?Diagnosis Date  ? Hepatitis A   ? Hypertension   ? ? ?Past Surgical History:  ? ?Past Surgical History:  ?Procedure Laterality Date  ? ABDOMINAL HYSTERECTOMY    ? CHOLECYSTECTOMY    ? ? ?Social History:  ? ?Social History  ? ?Socioeconomic History  ? Marital status: Widowed  ?  Spouse name: Not on file  ? Number of children: Not on file  ? Years of education: Not on file  ?  Highest education level: Not on file  ?Occupational History  ? Not on file  ?Tobacco Use  ? Smoking status: Every Day  ?  Packs/day: 2.00  ?  Years: 40.00  ?  Pack years: 80.00  ?  Types: Cigarettes  ?  Last attempt to quit: 01/30/2016  ?  Years since quitting: 5.5  ? Smokeless tobacco: Never  ? Tobacco comments:  ?  patient states that she started smoking again and does not want to try to quit again at this time.  ?Vaping Use  ? Vaping Use: Never used  ?Substance and Sexual Activity  ? Alcohol use: Yes  ?  Comment: occasional  ? Drug use: No  ? Sexual activity: Not on file  ?Other Topics Concern  ? Not on file  ?Social History Narrative  ? No regular exercise  ? Gardening, canning  ? ?Social Determinants of Health  ? ?Financial Resource Strain: Not on file  ?Food Insecurity: Not on file  ?Transportation Needs: Not on file  ?Physical Activity: Not on file  ?Stress: Not on file  ?Social Connections: Not on file  ?Intimate Partner Violence: Not on file  ? ? ?Allergies  ? ?Penicillins ? ?Family history:  ? ?Family History  ?Problem Relation Age of Onset  ? Arthritis Mother   ? Hypertension Mother   ? Cancer Father   ?     prost  ? Alcohol abuse Brother   ?Additional FH: ?2 sons.  1 son deceased from a GSW. Brother deceased from ETOH related complications, but not entirely sure. ? ?Current Medications:  ? ?Prior to Admission medications   ?Medication Sig Start Date End Date Taking? Authorizing Provider  ?albuterol (PROVENTIL HFA;VENTOLIN HFA) 108 (90 Base) MCG/ACT inhaler Inhale 2 puffs into the lungs every 6 (six) hours as needed for wheezing or shortness of breath. 05/25/17   Olive Bass, FNP  ?amLODipine (NORVASC) 5 MG tablet Take 1 tablet (5 mg total) by mouth daily. 09/02/17   Pincus Sanes, MD  ?benazepril (LOTENSIN) 10 MG tablet Take 1 tablet (10 mg total) by mouth daily. -- Office visit needed for further refills 09/02/17   Pincus Sanes, MD  ?benzonatate (TESSALON) 200 MG capsule Take 1 capsule (200 mg  total) by mouth 3 (three) times daily as needed for cough. ?Patient not taking: Reported on 08/24/2021 05/25/17   Olive Bass, FNP  ?clindamycin (CLEOCIN) 300 MG capsule Take 300 mg by mouth 3 (three) times daily.    [provider]  ?cycloSPORINE (RESTASIS) 0.05 % ophthalmic emulsion 1 drop 2 (two) times daily.    [provider]  ?doxycycline (VIBRA-TABS) 100 MG tablet Take 1 tablet (100 mg total) by mouth 2 (two) times daily. 05/25/17   Olive Bass, FNP  ?Multiple Vitamins-Minerals (MULTIVITAMIN) tablet Take 1 tablet by mouth daily. 01/06/16   Pincus Sanes, MD  ?mupirocin cream (BACTROBAN) 2 % Apply 1 application topically 2 (two) times daily as needed. 07/21/16   Pincus Sanes, MD  ?Omega 3 1200 MG CAPS Takes daily 01/06/16   Pincus Sanes, MD  ?vitamin C (ASCORBIC ACID) 500 MG tablet Take 1,000 mg by mouth daily.    [provider]  ? ? ?Physical Exam:  ? ?Vitals:  ? 08/24/21 1420 08/24/21 1430 08/24/21 1500 08/24/21 1530  ?BP: (!) 143/89 (!) 155/107 (!) 176/100 (!) 194/83  ?Pulse: 71 71 64 74  ?Resp: 20 18 17 18   ?Temp:      ?TempSrc:      ?SpO2: 95% 96% 96% 100%  ?Weight:      ?Height:      ? ? ? ?Physical Exam: ?Blood pressure (!) 194/83, pulse 74, temperature 97.9 ?F (36.6 ?C), temperature source Oral, resp. rate 18, height 5\' 6"  (1.676 m), weight 66.3 kg, SpO2 100 %. ?Gen: No acute distress. ?Head: Normocephalic, atraumatic. ?Eyes: Pupils equal, round and reactive to light. Extraocular movements intact.  Sclerae nonicteric. No lid lag. ?Mouth: Oropharynx reveals moist mucous membranes. Dentures in place. ?Neck: Supple, no thyromegaly, no lymphadenopathy, no jugular venous distention. ?Chest: Lungs are clear to auscultation with good air movement. No rales, rhonchi or wheezes.  ?CV: Heart sounds are regular with an S1, S2. No murmurs, rubs, clicks, or gallops.  ?Abdomen: Soft, nontender, nondistended with normal active bowel sounds.  ?Extremities: Extremities are  without clubbing, or cyanosis. No edema.  ?Skin: Warm and dry. No rashes, lesions or wounds. ?Neuro: Alert and oriented times 3; grossly nonfocal. No pronator drift. Tongue midline. Palate rises symmetrically. 5/5 strength in all tested muscle groups. ?Psych: Insight is good and judgment is appropriate. Mood and affect normal. ? ? ?Data Review:  ? ? ?Labs: ?Basic Metabolic Panel: ?Recent Labs  ?Lab 08/24/21 ?  ?NA 141  ?K 3.8  ?CL 105  ?GLUCOSE 111*  ?BUN 18  ?CREATININE 0.70  ? ?CBC: ?Recent Labs  ?Lab 08/24/21 ?[id number] 08/24/21 ?1113  ?WBC  --  6.8  ?NEUTROABS  --  3.3  ?HGB 14.6 14.7  ?HCT 43.0 43.0  ?MCV  --  96.6  ?PLT  --  283  ? ? ?CBG: ?Recent Labs  ?Lab 08/24/21 ?[id number]  ?GLUCAP 103*  ? ? ? ? ?Radiographic Studies: ?MR ANGIO HEAD WO CONTRAST ? ?Result Date: 08/24/2021 ?CLINICAL DATA:  Neuro deficit, acute, stroke suspected EXAM: MRI HEAD WITHOUT CONTRAST MRA HEAD WITHOUT CONTRAST TECHNIQUE: Multiplanar, multi-echo pulse sequences of the brain and surrounding structures were acquired without intravenous contrast. Angiographic images of the Circle of Willis were acquired using MRA technique without intravenous contrast. COMPARISON:  No pertinent prior exam. FINDINGS: MRI HEAD FINDINGS Brain: Asymmetric subtle diffusion hyperintensity in the left thalamocapsular region. Possible subtle corresponding T2 hyperintensity. There is no evidence of intracranial hemorrhage. There is no intracranial mass, mass effect, or edema. There is no hydrocephalus or extra-axial fluid collection. Ventricles and sulci are normal in size and configuration. Patchy foci of T2 hyperintensity in the supratentorial white matter are nonspecific but may reflect mild chronic microvascular ischemic changes. Vascular: Major vessel flow voids at the skull base are preserved. Skull and upper cervical spine: Normal marrow signal is preserved. Sinuses/Orbits: Paranasal sinuses are aerated. Orbits are unremarkable. Other: Sella is unremarkable.   Mastoid air cells are clear. MRA HEAD FINDINGS Motion artifact is present. Anterior circulation: Intracranial internal carotid arteries are patent with atherosclerotic irregularity. Anterior and middle cere

## 2021-08-24 NOTE — ED Notes (Signed)
Patient transported to CT 

## 2021-08-24 NOTE — Progress Notes (Signed)
Contacted by Dr. Wilkie Aye regarding this 76 yo patient who presented with paresthesias, now resolved, and was found to have small acute to subacute infarct in L thalamocapsular region. ? ?Recommendations: ?- Admit to hospitalist service at [facility] for stroke w/u ?- Permissive HTN x48 hrs from sx onset goal BP <220/110. PRN labetalol or hydralazine if BP above these parameters. Avoid oral antihypertensives. ?- MRA performed; will order CTA neck 2/2 posterior circulation stroke ?- NPO until passes nursing dysphagia screen ?- TTE w/ bubble ?- Check A1c and LDL + add statin per guidelines ?- ASA 81mg  daily + plavix 75mg  daily x21 days f/b ASA 81mg  daily monotherapy after that ?- q4 hr neuro checks ?- STAT head CT for any change in neuro exam ?- Tele ?- PT/OT/SLP ?- Stroke education ?- Amb referral to neurology upon discharge  ? ?If any further neurologic concerns arise during the hospitalization, please consult teleneurologist Dr. . ? ? , MD ?[facility] ?[phone] ? ?If 7pm- 7am, please page neurology on call as listed in [facility]. ? ?

## 2021-08-24 NOTE — ED Notes (Signed)
Alert and oriented x 4. No neuro deficits noted. Speech clear. Up to bathroom gait steady.  ?

## 2021-08-24 NOTE — ED Notes (Signed)
Up to bathroom. Gait steady. No signs of distress.  ?

## 2021-08-24 NOTE — ED Provider Notes (Signed)
?[place] EMERGENCY DEPARTMENT ?Provider Note ? ? ?CSN: [id number] ?Arrival date & time: 08/24/21  0818 ? ?  ? ?History ? ?Chief Complaint  ?Patient presents with  ? Gait Problem  ? ? ?Holly Gilmore is a 76 y.o. female. ? ?ICH, CVA, TIA, TIA ? ? ?76 year old female with past medical history of cholecystectomy, HTN presents emergency department with concern for right-sided weakness and unsteady gait.  Patient states when she went to bed at 11 PM last night she was in her usual state of health.  Woke up at 4 AM this morning to use the restroom which is normal for her.  She felt like the right arm and right leg was heavy/numb and had a difficult time walking and was falling to the right side at that time.  When her son came over this morning he felt like the speech was slurred and slow but no noted facial droop.  No vision changes.  Patient states the right arm and leg feel weak and slightly numb but improved from before.  She still feels like her speech is slow but almost back to normal.  No noted facial droop at this time.  Endorses mild headache present on awaking today. ? ?Home Medications ?Prior to Admission medications   ?Medication Sig Start Date End Date Taking? Authorizing Provider  ?albuterol (PROVENTIL HFA;VENTOLIN HFA) 108 (90 Base) MCG/ACT inhaler Inhale 2 puffs into the lungs every 6 (six) hours as needed for wheezing or shortness of breath. 05/25/17   Olive BassMurray, Laura Woodruff, FNP  ?amLODipine (NORVASC) 5 MG tablet Take 1 tablet (5 mg total) by mouth daily. 09/02/17   Pincus SanesBurns, Stacy J, MD  ?benazepril (LOTENSIN) 10 MG tablet Take 1 tablet (10 mg total) by mouth daily. -- Office visit needed for further refills 09/02/17   Pincus SanesBurns, Stacy J, MD  ?benzonatate (TESSALON) 200 MG capsule Take 1 capsule (200 mg total) by mouth 3 (three) times daily as needed for cough. 05/25/17   Olive BassMurray, Laura Woodruff, FNP  ?clindamycin (CLEOCIN) 300 MG capsule Take 300 mg by mouth 3 (three) times daily.    [provider]   ?cycloSPORINE (RESTASIS) 0.05 % ophthalmic emulsion 1 drop 2 (two) times daily.    [provider]  ?doxycycline (VIBRA-TABS) 100 MG tablet Take 1 tablet (100 mg total) by mouth 2 (two) times daily. 05/25/17   Olive BassMurray, Laura Woodruff, FNP  ?Multiple Vitamins-Minerals (MULTIVITAMIN) tablet Take 1 tablet by mouth daily. 01/06/16   Pincus SanesBurns, Stacy J, MD  ?mupirocin cream (BACTROBAN) 2 % Apply 1 application topically 2 (two) times daily as needed. 07/21/16   Pincus SanesBurns, Stacy J, MD  ?Omega 3 1200 MG CAPS Takes daily 01/06/16   Pincus SanesBurns, Stacy J, MD  ?vitamin C (ASCORBIC ACID) 500 MG tablet Take 1,000 mg by mouth daily.    [provider]  ?   ? ?Allergies    ?Penicillins   ? ?Review of Systems   ?Review of Systems  ?Constitutional:  Negative for fever.  ?Respiratory:  Negative for shortness of breath.   ?Cardiovascular:  Negative for chest pain.  ?Gastrointestinal:  Negative for abdominal pain, diarrhea and vomiting.  ?Skin:  Negative for rash.  ?Neurological:  Positive for dizziness, speech difficulty, weakness, numbness and headaches. Negative for tremors and facial asymmetry.  ? ?Physical Exam ?Updated Vital Signs ?BP (!) 180/64   Pulse 62   Temp 97.9 ?F (36.6 ?C) (Oral)   Resp 20   Ht 5\' 6"  (1.676 m)   Wt 66.3 kg  SpO2 98%   BMI 23.59 kg/m?  ?Physical Exam ?Vitals and nursing note reviewed.  ?Constitutional:   ?   Appearance: Normal appearance.  ?HENT:  ?   Head: Normocephalic.  ?   Mouth/Throat:  ?   Mouth: Mucous membranes are moist.  ?Cardiovascular:  ?   Rate and Rhythm: Normal rate.  ?Pulmonary:  ?   Effort: Pulmonary effort is normal. No respiratory distress.  ?Abdominal:  ?   Palpations: Abdomen is soft.  ?   Tenderness: There is no abdominal tenderness.  ?Skin: ?   General: Skin is warm.  ?Neurological:  ?   Mental Status: She is alert and oriented to person, place, and time.  ?   Comments: No noted facial droop, slow speech but not necessarily slurred, no aphasia, decreased sensation mainly in the  right thigh but intact strength  ?Psychiatric:     ?   Mood and Affect: Mood normal.  ? ? ?ED Results / Procedures / Treatments   ?Labs ?(all labs ordered are listed, but only abnormal results are displayed) ?Labs Reviewed  ?CBG MONITORING, ED - Abnormal; Notable for the following components:  ?    Result Value  ? Glucose-Capillary 103 (*)   ? All other components within normal limits  ?I-STAT CHEM 8, ED - Abnormal; Notable for the following components:  ? Glucose, Bld 111 (*)   ? All other components within normal limits  ?CBC WITH DIFFERENTIAL/PLATELET  ? ? ?EKG ?None ? ?Radiology ?No results found. ? ?Procedures ?Marland KitchenCritical Care ?Performed by: Rozelle Logan, DO ?Authorized by: Rozelle Logan, DO  ? ?Critical care provider statement:  ?  Critical care time (minutes):  45 ?  Critical care time was exclusive of:  Separately billable procedures and treating other patients ?  Critical care was necessary to treat or prevent imminent or life-threatening deterioration of the following conditions:  CNS failure or compromise ?  Critical care was time spent personally by me on the following activities:  Development of treatment plan with patient or surrogate, discussions with consultants, evaluation of patient's response to treatment, examination of patient, ordering and review of laboratory studies, ordering and review of radiographic studies, ordering and performing treatments and interventions, pulse oximetry, re-evaluation of patient's condition and review of old charts ?  I assumed direction of critical care for this patient from another provider in my specialty: no   ?  Care discussed with: admitting provider    ? ? ?Medications Ordered in ED ?Medications - No data to display ? ?ED Course/ Medical Decision Making/ A&P ?  ?                        ?Medical Decision Making ?Amount and/or Complexity of Data Reviewed ?Labs: ordered. ?Radiology: ordered. ? ?Risk ?Decision regarding hospitalization. ? ? ?This patient  presents to the ED for concern of right-sided weakness/slurred speech, this involves an extensive number of treatment options, and is a complaint that carries with it a high risk of complications and morbidity.  The differential diagnosis includes CVA, TIA, ICH  ? ? ?Additional history obtained: ?-Additional history obtained from son at bedside ?-External records from outside source obtained and reviewed including: Chart review including previous notes, labs, imaging, consultation notes ? ? ?Lab Tests: ?-I ordered, reviewed, and interpreted labs.  The pertinent results include: Baseline blood work ? ? ?EKG ?-Sinus rhythm ? ? ?Imaging Studies ordered: ?-I ordered imaging studies including MRI of the brain ?-  I independently visualized and interpreted imaging which showed subacute left thalamic stroke ?-I agree with the radiologist interpretation ? ? ? ?ED Course: ?76 year old female presents emergency department for right upper and lower extremity weakness/numbness and slurred speech.  Most prominent at 4 AM this morning, improving on my evaluation .  Mild headache.  Slightly hypertensive on arrival.  NIH of 2 for sensory changes in slow speech.  MRI imaging is significant for subacute left thalamic stroke.  MRA of the head shows no acute finding.  Consulted with neurology, they recommend hospitalist admission, further neuroimaging and evaluation.  Patient and spouse at bedside agree with admission. ? ? ?Consultations Obtained: ?I requested consultation with the neurology, Dr. Selina Cooley,  and discussed lab and imaging findings as well as pertinent plan - they recommend: Inpatient admission and further testing ? ? ?Critical Interventions: ?MRI for acute stroke, neurology consultation ? ? ?Cardiac Monitoring: ?The patient was maintained on a cardiac monitor.  I personally viewed and interpreted the cardiac monitored which showed an underlying rhythm of: Sinus ? ? ?Reevaluation: ?After the interventions noted above, I  reevaluated the patient and found that they have :improved ? ? ?Dispostion: ?Patients evaluation and results requires admission for further treatment and care.  Spoke with hospitalist, reviewed patient's ED course an

## 2021-08-24 NOTE — ED Triage Notes (Addendum)
Pt c/o lack of coordination on the right side of her body that she noticed when she woke up at 0400 this morning. She woke up and felt like she was stumbling. Pt reports she went to bed at 2300 last night and felt normal. Son at bedside also reports pt's speech doesn't sound normal.  ?

## 2021-08-24 NOTE — ED Notes (Signed)
Per security patient was seen walking back inside via registration after showing registration her wrist band. Patient ambulatory back to her room. When this nurse asked if patient went to smoke a cigarette patient responded yes. Patient educated on the importance of staying in her room and updating on leaving the ED policy due to patient's CVA. Patient verbalized agreement to staying in her room unless authorized by her nurse or provider. Nicotine patch applied to patient at this time.  ?

## 2021-08-24 NOTE — ED Notes (Signed)
Vital signs stable. 

## 2021-08-25 ENCOUNTER — Observation Stay (HOSPITAL_BASED_OUTPATIENT_CLINIC_OR_DEPARTMENT_OTHER): Payer: PPO

## 2021-08-25 DIAGNOSIS — I639 Cerebral infarction, unspecified: Secondary | ICD-10-CM | POA: Diagnosis not present

## 2021-08-25 DIAGNOSIS — I6389 Other cerebral infarction: Secondary | ICD-10-CM | POA: Diagnosis not present

## 2021-08-25 LAB — HEMOGLOBIN A1C
Hgb A1c MFr Bld: 5.3 % (ref 4.8–5.6)
Mean Plasma Glucose: 105.41 mg/dL

## 2021-08-25 LAB — LIPID PANEL
Cholesterol: 219 mg/dL — ABNORMAL HIGH (ref 0–200)
HDL: 54 mg/dL (ref >40)
LDL Cholesterol: 153 mg/dL — ABNORMAL HIGH (ref 0–99)
Total CHOL/HDL Ratio: 4.1 RATIO
Triglycerides: 61 mg/dL (ref <150)
VLDL: 12 mg/dL (ref 0–40)

## 2021-08-25 LAB — ECHOCARDIOGRAM COMPLETE
AR max vel: 2.05 cm2
AV Area VTI: 2.18 cm2
AV Area mean vel: 2.19 cm2
AV Mean grad: 3 mmHg
AV Peak grad: 7.3 mmHg
Ao pk vel: 1.35 m/s
Area-P 1/2: 2.33 cm2
Calc EF: 66 %
Height: 66 in
MV VTI: 2.34 cm2
S' Lateral: 2.5 cm
Single Plane A2C EF: 67.9 %
Single Plane A4C EF: 65.3 %
Weight: 2286.4 oz

## 2021-08-25 MED ORDER — ATORVASTATIN CALCIUM 40 MG PO TABS: 40.0000 mg | ORAL_TABLET | Freq: Every day | ORAL | 11 refills | Status: DC

## 2021-08-25 MED ORDER — AMLODIPINE BESYLATE 10 MG PO TABS
10.0000 mg | ORAL_TABLET | Freq: Every day | ORAL | 0 refills | Status: DC
Start: 2021-08-25 — End: 2023-09-21

## 2021-08-25 MED ORDER — BENAZEPRIL HCL 10 MG PO TABS
10.0000 mg | ORAL_TABLET | ORAL | Status: AC
  Administered 2021-08-25: 10 mg via ORAL
  Filled 2021-08-25: qty 1

## 2021-08-25 MED ORDER — CLOPIDOGREL BISULFATE 75 MG PO TABS: 75.0000 mg | ORAL_TABLET | Freq: Every day | ORAL | 0 refills | Status: AC

## 2021-08-25 MED ORDER — BENAZEPRIL HCL 20 MG PO TABS: 20.0000 mg | ORAL_TABLET | Freq: Every day | ORAL | 0 refills | Status: DC

## 2021-08-25 MED ORDER — ASPIRIN EC 81 MG PO TBEC: 81.0000 mg | DELAYED_RELEASE_TABLET | Freq: Every day | ORAL | 2 refills | Status: AC

## 2021-08-25 MED ORDER — AMLODIPINE BESYLATE 5 MG PO TABS
5.0000 mg | ORAL_TABLET | Freq: Once | ORAL | Status: AC
  Administered 2021-08-25: 5 mg via ORAL
  Filled 2021-08-25: qty 1

## 2021-08-25 NOTE — Progress Notes (Signed)
Patient refused to place telemetry back on , notified Dr. Natale Milch he is aware ?

## 2021-08-25 NOTE — TOC Progression Note (Signed)
Transition of Care (TOC) - Progression Note  ? ? ?Patient Details  ?Name: Holly Gilmore ?MRN: [id number] ?Date of Birth: 1946-03-03 ? ?Transition of Care (TOC) CM/SW Contact  ?Karn Cassis, LCSW ?Phone Number: ?08/25/2021, 10:39 AM ? ?Clinical Narrative:   ?Transition of Care (TOC) Screening Note ? ? ?Patient Details  ?Name: Holly Gilmore ?Date of Birth: 10-Feb-1946 ? ? ?Transition of Care (TOC) CM/SW Contact:    ?Karn Cassis, LCSW ?Phone Number: ?08/25/2021, 10:39 AM ? ? ? ?Transition of Care Department [facility]) has reviewed patient and no TOC needs have been identified at this time. We will continue to monitor patient advancement through interdisciplinary progression rounds. If new patient transition needs arise, please place a TOC consult. ?    ? ? ? ?  ?  ? ?Expected Discharge Plan and Services ?  ?  ?  ?  ?  ?                ?  ?  ?  ?  ?  ?  ?  ?  ?  ?  ? ? ?Social Determinants of Health (SDOH) Interventions ?  ? ?Readmission Risk Interventions ?   ? View : No data to display.  ?  ?  ?  ? ? ?

## 2021-08-25 NOTE — Discharge Summary (Signed)
Physician Discharge Summary  ?Holly Gilmore [id number] DOB: [birth year] DOA: 08/24/2021 ? ?PCP: Celene Squibb, MD ? ?Admit date: 08/24/2021 ?Discharge date: 08/25/2021 ? ?Admitted From: Home ?Disposition: Home ? ?Recommendations for Outpatient Follow-up:  ?Follow up with PCP in 1-2 weeks ?Follow-up with neurology as scheduled ? ?Home Health: None ?Equipment/Devices: None ? ?Discharge Condition: Stable ?CODE STATUS: Full ?Diet recommendation: Low-salt low-fat diet ? ?Brief/Interim Summary: ?Holly Gilmore is an 76 y.o. female with a PMH of HTN and a 2 pack a day active smoking habit who presented to the ED with a chief complaint of right sided weakness associated with an uncoordinated gait upon awakening 08/24/2021 -last known well overnight on the ninth.   ? ?Patient admitted for CVA work-up, MRI MRA shows subacute small vessel infarct left thalamic capsular region, neurology consulted for follow-up.  Given work-up including CT, MRI MRA, echocardiogram neurology recommended statin as well as both aspirin/Plavix for 21 days with transition to aspirin monotherapy thereafter, close follow-up outpatient with PCP and neurology as scheduled.  Patient symptoms have resolved at this point she has improved drastically with no further deficits, passed speech without any overt issues and is otherwise back to baseline. ? ?Discharge Diagnoses:  ?Principal Problem: ?  Acute CVA (cerebrovascular accident) ([place]) ?Active Problems: ?  Essential hypertension, benign ?  Hyperlipidemia ?  Hyperglycemia ?  Tobacco abuse ? ? ? ?Discharge Instructions ? ? ?Allergies as of 08/25/2021   ? ?   Reactions  ? Penicillins Hives  ? Has patient had a PCN reaction causing immediate rash, facial/tongue/throat swelling, SOB or lightheadedness with hypotension: No ?Has patient had a PCN reaction causing severe rash involving mucus membranes or skin necrosis: No ?Has patient had a PCN reaction that required hospitalization No ?Has patient had a PCN  reaction occurring within the last 10 years: No ?If all of the above answers are "NO", then may proceed with Cephalosporin use.  ? ?  ? ?  ?Medication List  ?  ? ?STOP taking these medications   ? ?benzonatate 200 MG capsule ?Commonly known as: TESSALON ?  ?doxycycline 100 MG tablet ?Commonly known as: VIBRA-TABS ?  ?mupirocin cream 2 % ?Commonly known as: Bactroban ?  ? ?  ? ?TAKE these medications   ? ?albuterol 108 (90 Base) MCG/ACT inhaler ?Commonly known as: VENTOLIN HFA ?Inhale 2 puffs into the lungs every 6 (six) hours as needed for wheezing or shortness of breath. ?  ?amLODipine 10 MG tablet ?Commonly known as: NORVASC ?Take 1 tablet (10 mg total) by mouth daily. ?What changed:  ?medication strength ?how much to take ?  ?aspirin EC 81 MG tablet ?Take 1 tablet (81 mg total) by mouth daily. Swallow whole. ?  ?atorvastatin 40 MG tablet ?Commonly known as: Lipitor ?Take 1 tablet (40 mg total) by mouth daily. ?  ?benazepril 20 MG tablet ?Commonly known as: LOTENSIN ?Take 1 tablet (20 mg total) by mouth daily. -- Office visit needed for further refills ?What changed:  ?medication strength ?how much to take ?  ?clopidogrel 75 MG tablet ?Commonly known as: Plavix ?Take 1 tablet (75 mg total) by mouth daily for 21 days. ?  ?cycloSPORINE 0.05 % ophthalmic emulsion ?Commonly known as: RESTASIS ?1 drop 2 (two) times daily. ?  ?multivitamin tablet ?Take 1 tablet by mouth daily. ?  ?Omega 3 1200 MG Caps ?Takes daily ?What changed:  ?how much to take ?how to take this ?when to take this ?additional instructions ?  ?vitamin C 500 MG tablet ?Commonly  known as: ASCORBIC ACID ?Take 1,000 mg by mouth daily. ?  ? ?  ? ? ?Allergies  ?Allergen Reactions  ? Penicillins Hives  ?  Has patient had a PCN reaction causing immediate rash, facial/tongue/throat swelling, SOB or lightheadedness with hypotension: No ?Has patient had a PCN reaction causing severe rash involving mucus membranes or skin necrosis: No ?Has patient had a PCN  reaction that required hospitalization No ?Has patient had a PCN reaction occurring within the last 10 years: No ?If all of the above answers are "NO", then may proceed with Cephalosporin use.  ? ? ?Consultations: ?Neurology ? ? ?Procedures/Studies: ?CT ANGIO NECK W OR WO CONTRAST ? ?Result Date: 08/24/2021 ?CLINICAL DATA:  CVA workup, posterior circulation stroke, evaluate carotid artery stenosis EXAM: CT ANGIOGRAPHY NECK TECHNIQUE: Multidetector CT imaging of the neck was performed using the standard protocol during bolus administration of intravenous contrast. Multiplanar CT image reconstructions and MIPs were obtained to evaluate the vascular anatomy. Carotid stenosis measurements (when applicable) are obtained utilizing NASCET criteria, using the distal internal carotid diameter as the denominator. RADIATION DOSE REDUCTION: This exam was performed according to the departmental dose-optimization program which includes automated exposure control, adjustment of the mA and/or kV according to patient size and/or use of iterative reconstruction technique. CONTRAST:  90mL OMNIPAQUE IOHEXOL 350 MG/ML SOLN COMPARISON:  None. FINDINGS: Aortic arch: There is calcified atherosclerotic plaque of the aortic arch. The origins of the major branch vessels are patent. Subclavian arteries are patent to the level imaged. Right carotid system: The right common carotid artery is patent. There is calcified atherosclerotic plaque at the bifurcation resulting in less than 50% stenosis of the proximal internal carotid artery. The distal right internal carotid artery is widely patent. There is moderate stenosis at the origin of the right external carotid artery. There is no evidence of dissection or aneurysm. Left carotid system: The left common carotid artery is patent. There is calcified atherosclerotic plaque in the proximal left internal carotid artery resulting in less than 50% stenosis. The distal left internal carotid artery is  widely patent. The left external carotid artery is patent. There is no evidence of dissection or aneurysm. Vertebral arteries: Vertebral arteries are patent, without hemodynamically significant stenosis or occlusion. There is no dissection or aneurysm. Skeleton: There is degenerative change of the cervical spine most advanced at C5-C6 and C6-C7 with slight reversal of the normal curvature centered at C5-C6. There is no acute osseous abnormality or suspicious osseous lesion. There is no visible canal hematoma. Other neck: Thyroid is heterogeneous with scattered subcentimeter nodules. No specific imaging follow-up is required. The soft tissues are otherwise unremarkable. Upper chest: There is emphysema in the lung apices. IMPRESSION: 1. Calcified plaque in the proximal internal carotid arteries resulting in less than 50% stenosis bilaterally. 2. Patent vertebral arteries in the neck. 3. Aortic Atherosclerosis (ICD10-I70.0) and Emphysema (ICD10-J43.9). Electronically Signed   By: Valetta Mole M.D.   On: 08/24/2021 16:11  ? ?MR ANGIO HEAD WO CONTRAST ? ?Result Date: 08/24/2021 ?CLINICAL DATA:  Neuro deficit, acute, stroke suspected EXAM: MRI HEAD WITHOUT CONTRAST MRA HEAD WITHOUT CONTRAST TECHNIQUE: Multiplanar, multi-echo pulse sequences of the brain and surrounding structures were acquired without intravenous contrast. Angiographic images of the Circle of Willis were acquired using MRA technique without intravenous contrast. COMPARISON:  No pertinent prior exam. FINDINGS: MRI HEAD FINDINGS Brain: Asymmetric subtle diffusion hyperintensity in the left thalamocapsular region. Possible subtle corresponding T2 hyperintensity. There is no evidence of intracranial hemorrhage. There is no  intracranial mass, mass effect, or edema. There is no hydrocephalus or extra-axial fluid collection. Ventricles and sulci are normal in size and configuration. Patchy foci of T2 hyperintensity in the supratentorial white matter are  nonspecific but may reflect mild chronic microvascular ischemic changes. Vascular: Major vessel flow voids at the skull base are preserved. Skull and upper cervical spine: Normal marrow signal is preserved. Sinuses/Orb

## 2021-08-25 NOTE — Care Management Obs Status (Signed)
MEDICARE OBSERVATION STATUS NOTIFICATION ? ? ?Patient Details  ?Name: Holly Gilmore ?MRN: [id number] ?Date of Birth: [birth year] ? ? ?Medicare Observation Status Notification Given:  Yes ? ? ? ?Karn Cassis, LCSW ?08/25/2021, 10:30 AM ?

## 2021-08-25 NOTE — Progress Notes (Signed)
Patient discharged with follow up appointments, educated on s/s of and prevention of stroke. Verbalized understanding, educated on the use of plavix and her Bp medications had changed, IV removed from RFA , Patient ambulatory to the main entrance and to boyfriends personal vehicle ? ?

## 2021-08-25 NOTE — Progress Notes (Signed)
SLP Cancellation Note ? ?Patient Details ?Name: Holly Gilmore ?MRN: [id number] ?DOB: 13-Oct-1945 ? ? ?Cancelled treatment:       Reason Eval/Treat Not Completed: SLP screened, no needs identified, will sign off. Speech language and cognition are functioning at baseline; thank you for this referral. ? ?[name] MA, CCC-SLP ?Speech Language Pathologist ? ? ? ?[name] ?08/25/2021, 10:39 AM ?

## 2021-08-25 NOTE — Progress Notes (Signed)
*  PRELIMINARY RESULTS* ?Echocardiogram ?2D Echocardiogram has been performed. ? ?Carolyne Fiscal ?08/25/2021, 9:36 AM ?

## 2021-08-25 NOTE — Evaluation (Signed)
Physical Therapy Evaluation ?Patient Details ?Name: Holly Gilmore ?MRN: [id number] ?DOB: [birth year] ?Today's Date: 08/25/2021 ? ?History of Present Illness ? Holly Gilmore is an 76 y.o. female with a PMH of HTN and a 2 pack a day active smoking habit who presented to the ED with a chief complaint of right sided weakness associated with an uncoordinated gait upon awakening this morning. She was last known to be asymptomatic at around 11 pm the night before.  The patient's son also reported some changes in normal speech, but was conversant upon my evaluation.  [name] indicates she has also had high cholesterol in past, but has been reluctant to take statins, preferring dietary changes.  She also stopped taking daily low dose ASA 4-5 years ago. ?  ?Clinical Impression ? Patient functioning at baseline for functional mobility and gait demonstrating good return for bed mobility, transfers and ambulating on level, inclined, and declined surfaces without loss of balance.  Plan:  Patient discharged from physical therapy to care of nursing for ambulation daily as tolerated for length of stay.  ?   ?   ? ?Recommendations for follow up therapy are one component of a multi-disciplinary discharge planning process, led by the attending physician.  Recommendations may be updated based on patient status, additional functional criteria and insurance authorization. ? ?Follow Up Recommendations No PT follow up ? ?  ?Assistance Recommended at Discharge    ?Patient can return home with the following ? Other (comment) (patient at baseline) ? ?  ?Equipment Recommendations None recommended by PT  ?Recommendations for Other Services ?    ?  ?Functional Status Assessment    ? ?  ?Precautions / Restrictions Precautions ?Precautions: None ?Restrictions ?Weight Bearing Restrictions: No  ? ?  ? ?Mobility ? Bed Mobility ?Overal bed mobility: Independent ?  ?  ?  ?  ?  ?  ?  ?  ? ?Transfers ?Overall transfer level: Independent ?  ?  ?   ?  ?  ?  ?  ?  ?  ?  ? ?Ambulation/Gait ?  ?Gait Distance (Feet): 200 Feet ?Assistive device: None ?Gait Pattern/deviations: WFL(Within Functional Limits) ?Gait velocity: near normal ?  ?  ?General Gait Details: demonstrates good return for ambulation on level, inclined and declined surfaces without loss of balance ? ?Stairs ?  ?  ?  ?  ?  ? ?Wheelchair Mobility ?  ? ?Modified Rankin (Stroke Patients Only) ?  ? ?  ? ?Balance Overall balance assessment: No apparent balance deficits (not formally assessed) ?  ?  ?  ?  ?  ?  ?  ?  ?  ?  ?  ?  ?  ?  ?  ?  ?  ?  ?   ? ? ? ?Pertinent Vitals/Pain Pain Assessment ?Pain Assessment: No/denies pain  ? ? ?Home Living Family/patient expects to be discharged to:: Private residence ?Living Arrangements: Children ?Available Help at Discharge: Family;Available PRN/intermittently ?Type of Home: House ?Home Access: Level entry ?  ?  ?Alternate Level Stairs-Number of Steps: 10 ?Home Layout: Two level ?Home Equipment: Shower seat - built in ?   ?  ?Prior Function Prior Level of Function : Independent/Modified Independent ?  ?  ?  ?  ?  ?  ?Mobility Comments: Independent ambulator ?ADLs Comments: Indepndent ADL's and IADL's. ?  ? ? ?Hand Dominance  ? Dominant Hand: Right ? ?  ?Extremity/Trunk Assessment  ? Upper Extremity Assessment ?Upper Extremity Assessment: Defer to OT evaluation ?  ? ?  Lower Extremity Assessment ?Lower Extremity Assessment: Overall WFL for tasks assessed ?  ? ?Cervical / Trunk Assessment ?Cervical / Trunk Assessment: Normal  ?Communication  ? Communication: No difficulties  ?Cognition Arousal/Alertness: Awake/alert ?Behavior During Therapy: [facility] for tasks assessed/performed ?Overall Cognitive Status: Within Functional Limits for tasks assessed ?  ?  ?  ?  ?  ?  ?  ?  ?  ?  ?  ?  ?  ?  ?  ?  ?  ?  ?  ? ?  ?General Comments   ? ?  ?Exercises    ? ?Assessment/Plan  ?  ?PT Assessment Patient does not need any further PT services  ?PT Problem List   ? ?   ?  ?PT Treatment  Interventions     ? ?PT Goals (Current goals can be found in the Care Plan section)  ?Acute Rehab PT Goals ?Patient Stated Goal: return home ?PT Goal Formulation: With patient/family ?Time For Goal Achievement: 08/25/21 ?Potential to Achieve Goals: Good ? ?  ?Frequency   ?  ? ? ?Co-evaluation PT/OT/SLP Co-Evaluation/Treatment: Yes ?Reason for Co-Treatment: To address functional/ADL transfers ?PT goals addressed during session: Mobility/safety with mobility;Balance ?OT goals addressed during session: ADL's and self-care ?  ? ? ?  ?AM-PAC PT "6 Clicks" Mobility  ?Outcome Measure Help needed turning from your back to your side while in a flat bed without using bedrails?: None ?Help needed moving from lying on your back to sitting on the side of a flat bed without using bedrails?: None ?Help needed moving to and from a bed to a chair (including a wheelchair)?: None ?Help needed standing up from a chair using your arms (e.g., wheelchair or bedside chair)?: None ?Help needed to walk in hospital room?: None ?Help needed climbing 3-5 steps with a railing? : None ?6 Click Score: 24 ? ?  ?End of Session   ?Activity Tolerance: Patient tolerated treatment well ?Patient left: in bed;with call bell/phone within reach;with family/visitor present ?Nurse Communication: Mobility status ?PT Visit Diagnosis: Unsteadiness on feet (R26.81);Other abnormalities of gait and mobility (R26.89);Muscle weakness (generalized) (M62.81) ?  ? ?Time: 0932-6712 ?PT Time Calculation (min) (ACUTE ONLY): 18 min ? ? ?Charges:   PT Evaluation ?$PT Eval Low Complexity: 1 Low ?PT Treatments ?$Therapeutic Activity: 8-22 mins ?  ?   ? ? ?12:12 PM, 08/25/21 ?Ocie Bob, MPT ?Physical Therapist with [place] ?[facility] ?[phone] office ?[phone] mobile phone ? ? ?

## 2021-08-25 NOTE — Evaluation (Signed)
Occupational Therapy Evaluation ?Patient Details ?Name: Holly Gilmore ?MRN: [id number] ?DOB: [birth year] ?Today's Date: 08/25/2021 ? ? ?History of Present Illness Holly Gilmore is an 76 y.o. female with a PMH of HTN and a 2 pack a day active smoking habit who presented to the ED with a chief complaint of right sided weakness associated with an uncoordinated gait upon awakening this morning. She was last known to be asymptomatic at around 11 pm the night before.  The patient's son also reported some changes in normal speech, but was conversant upon my evaluation.  Holly Gilmore indicates she has also had high cholesterol in past, but has been reluctant to take statins, preferring dietary changes.  She also stopped taking daily low dose ASA 4-5 years ago. (per MD)  ? ?Clinical Impression ?  ?Pt agreeable to OT and PT co-evaluation. Pt operating at or near baseline levels. WFL B UE functional use and strength. Independent ambulation in hall and room without AD. Able to don shoes and socks independently. Pt is not recommended for further acute OT services and will be discharged to care of nursing staff for remaining length of stay.  ?   ? ?Recommendations for follow up therapy are one component of a multi-disciplinary discharge planning process, led by the attending physician.  Recommendations may be updated based on patient status, additional functional criteria and insurance authorization.  ? ?Follow Up Recommendations ? No OT follow up  ?  ?Assistance Recommended at Discharge None  ?Patient can return home with the following   ? ?  ?Functional Status Assessment ? Patient has not had a recent decline in their functional status  ?Equipment Recommendations ? None recommended by OT  ?  ?Recommendations for Other Services   ? ? ?  ?Precautions / Restrictions Precautions ?Precautions: None ?Restrictions ?Weight Bearing Restrictions: No  ? ?  ? ?Mobility Bed Mobility ?Overal bed mobility: Independent ?  ?  ?  ?  ?  ?  ?   ?  ? ?Transfers ?Overall transfer level: Independent ?  ?  ?  ?  ?  ?  ?  ?  ?  ?  ? ?  ?Balance Overall balance assessment: Independent ?  ?  ?  ?  ?  ?  ?  ?  ?  ?  ?  ?  ?  ?  ?  ?  ?  ?  ?   ? ?ADL either performed or assessed with clinical judgement  ? ?ADL Overall ADL's : Independent ?  ?  ?  ?  ?  ?  ?  ?  ?  ?  ?  ?  ?  ?  ?  ?  ?  ?  ?  ?General ADL Comments: Don shoes without assist. Indepdnent ambulation.  ? ? ? ?Vision Baseline Vision/History: 1 Wears glasses ?Ability to See in Adequate Light: 1 Impaired ?Patient Visual Report: No change from baseline ?Vision Assessment?: No apparent visual deficits (Good tracking and visual fields)  ?   ? ? ? ? ?Hand Dominance Right ?  ?Extremity/Trunk Assessment Upper Extremity Assessment ?Upper Extremity Assessment: Overall WFL for tasks assessed ?  ?Lower Extremity Assessment ?Lower Extremity Assessment: Defer to PT evaluation ?  ?Cervical / Trunk Assessment ?Cervical / Trunk Assessment: Normal ?  ?Communication Communication ?Communication: No difficulties ?  ?Cognition Arousal/Alertness: Awake/alert ?Behavior During Therapy: [facility] for tasks assessed/performed ?Overall Cognitive Status: Within Functional Limits for tasks assessed ?  ?  ?  ?  ?  ?  ?  ?  ?  ?  ?  ?  ?  ?  ?  ?  ?  ?  ?  ?   ? ?  ?   ?  ?    ? ? ?  Home Living Family/patient expects to be discharged to:: Private residence ?Living Arrangements: Children ?Available Help at Discharge: Family;Available PRN/intermittently ?Type of Home: House ?Home Access: Level entry ?  ?  ?Home Layout: Two level (basement) ?Alternate Level Stairs-Number of Steps: 10 ?Alternate Level Stairs-Rails: Can reach both;Left;Right ?Bathroom Shower/Tub: Walk-in shower ?  ?Bathroom Toilet: Handicapped height ?Bathroom Accessibility: Yes ?How Accessible: Accessible via walker ?Home Equipment: Shower seat - built in ?  ?  ?  ? ?  ?Prior Functioning/Environment Prior Level of Function : Independent/Modified Independent ?  ?  ?  ?  ?  ?   ?Mobility Comments: Independent ambulator ?ADLs Comments: Indepndent ADL's and IADL's. ?  ? ?  ?  ?   ?  ?   ?    ?  ?OT Goals(Current goals can be found in the care plan section) Acute Rehab OT Goals ?Patient Stated Goal: return home  ?OT Frequency:   ?  ? ?Co-evaluation PT/OT/SLP Co-Evaluation/Treatment: Yes ?Reason for Co-Treatment: To address functional/ADL transfers ?  ?OT goals addressed during session: ADL's and self-care ?  ? ?  ?AM-PAC OT "6 Clicks" Daily Activity     ?Outcome Measure Help from another person eating meals?: None ?Help from another person taking care of personal grooming?: None ?Help from another person toileting, which includes using toliet, bedpan, or urinal?: None ?Help from another person bathing (including washing, rinsing, drying)?: None ?Help from another person to put on and taking off regular upper body clothing?: None ?Help from another person to put on and taking off regular lower body clothing?: None ?6 Click Score: 24 ?  ?End of Session   ? ?Activity Tolerance: Patient tolerated treatment well ?Patient left: in bed;with call bell/phone within reach;with family/visitor present ? ?OT Visit Diagnosis: Other symptoms and signs involving the nervous system (R29.898);Muscle weakness (generalized) (M62.81)  ?              ?Time: 6073-7106 ?OT Time Calculation (min): 11 min ?Charges:  OT General Charges ?$OT Visit: 1 Visit ?OT Evaluation ?$OT Eval Low Complexity: 1 Low ? ?Danie Chandler OT, MOT ? ?Danie Chandler ?08/25/2021, 9:39 AM ?

## 2021-08-31 DIAGNOSIS — E785 Hyperlipidemia, unspecified: Secondary | ICD-10-CM | POA: Diagnosis not present

## 2021-08-31 DIAGNOSIS — I10 Essential (primary) hypertension: Secondary | ICD-10-CM | POA: Diagnosis not present

## 2021-08-31 DIAGNOSIS — F17200 Nicotine dependence, unspecified, uncomplicated: Secondary | ICD-10-CM | POA: Diagnosis not present

## 2021-08-31 DIAGNOSIS — J439 Emphysema, unspecified: Secondary | ICD-10-CM | POA: Diagnosis not present

## 2021-08-31 DIAGNOSIS — G459 Transient cerebral ischemic attack, unspecified: Secondary | ICD-10-CM | POA: Diagnosis not present

## 2021-08-31 DIAGNOSIS — I700 Atherosclerosis of aorta: Secondary | ICD-10-CM | POA: Diagnosis not present

## 2021-09-12 DIAGNOSIS — R070 Pain in throat: Secondary | ICD-10-CM | POA: Diagnosis not present

## 2021-09-25 DIAGNOSIS — R070 Pain in throat: Secondary | ICD-10-CM | POA: Diagnosis not present

## 2021-10-13 DIAGNOSIS — I10 Essential (primary) hypertension: Secondary | ICD-10-CM | POA: Diagnosis not present

## 2021-10-19 DIAGNOSIS — I700 Atherosclerosis of aorta: Secondary | ICD-10-CM | POA: Diagnosis not present

## 2021-10-19 DIAGNOSIS — J439 Emphysema, unspecified: Secondary | ICD-10-CM | POA: Diagnosis not present

## 2021-10-19 DIAGNOSIS — Z0001 Encounter for general adult medical examination with abnormal findings: Secondary | ICD-10-CM | POA: Diagnosis not present

## 2021-10-19 DIAGNOSIS — G459 Transient cerebral ischemic attack, unspecified: Secondary | ICD-10-CM | POA: Diagnosis not present

## 2021-10-19 DIAGNOSIS — I10 Essential (primary) hypertension: Secondary | ICD-10-CM | POA: Diagnosis not present

## 2021-10-19 DIAGNOSIS — F17200 Nicotine dependence, unspecified, uncomplicated: Secondary | ICD-10-CM | POA: Diagnosis not present

## 2021-10-19 DIAGNOSIS — J0100 Acute maxillary sinusitis, unspecified: Secondary | ICD-10-CM | POA: Diagnosis not present

## 2021-10-19 DIAGNOSIS — E785 Hyperlipidemia, unspecified: Secondary | ICD-10-CM | POA: Diagnosis not present

## 2021-11-27 DIAGNOSIS — R1310 Dysphagia, unspecified: Secondary | ICD-10-CM | POA: Diagnosis not present

## 2021-11-30 DIAGNOSIS — H10021 Other mucopurulent conjunctivitis, right eye: Secondary | ICD-10-CM | POA: Diagnosis not present

## 2021-11-30 DIAGNOSIS — I10 Essential (primary) hypertension: Secondary | ICD-10-CM | POA: Diagnosis not present

## 2022-04-05 ENCOUNTER — Ambulatory Visit (HOSPITAL_COMMUNITY)
Admission: RE | Admit: 2022-04-05 | Discharge: 2022-04-05 | Disposition: A | Discharge status: Home / Self Care | Payer: PPO | Source: Ambulatory Visit | Attending: Family Medicine | Admitting: Family Medicine

## 2022-04-05 ENCOUNTER — Other Ambulatory Visit (HOSPITAL_COMMUNITY): Payer: Self-pay | Admitting: Family Medicine

## 2022-04-05 DIAGNOSIS — R0600 Dyspnea, unspecified: Secondary | ICD-10-CM | POA: Diagnosis not present

## 2022-04-05 DIAGNOSIS — R079 Chest pain, unspecified: Secondary | ICD-10-CM | POA: Diagnosis not present

## 2022-04-05 DIAGNOSIS — J069 Acute upper respiratory infection, unspecified: Secondary | ICD-10-CM | POA: Diagnosis not present

## 2022-04-05 DIAGNOSIS — J439 Emphysema, unspecified: Secondary | ICD-10-CM | POA: Diagnosis not present

## 2022-04-05 DIAGNOSIS — I10 Essential (primary) hypertension: Secondary | ICD-10-CM | POA: Diagnosis not present

## 2022-04-05 DIAGNOSIS — R059 Cough, unspecified: Secondary | ICD-10-CM | POA: Diagnosis not present

## 2022-04-07 ENCOUNTER — Other Ambulatory Visit (HOSPITAL_COMMUNITY): Payer: Self-pay | Admitting: Family Medicine

## 2022-04-07 DIAGNOSIS — R911 Solitary pulmonary nodule: Secondary | ICD-10-CM

## 2022-04-13 DIAGNOSIS — I10 Essential (primary) hypertension: Secondary | ICD-10-CM | POA: Diagnosis not present

## 2022-04-13 DIAGNOSIS — E782 Mixed hyperlipidemia: Secondary | ICD-10-CM | POA: Diagnosis not present

## 2022-04-20 DIAGNOSIS — J439 Emphysema, unspecified: Secondary | ICD-10-CM | POA: Diagnosis not present

## 2022-04-20 DIAGNOSIS — I700 Atherosclerosis of aorta: Secondary | ICD-10-CM | POA: Diagnosis not present

## 2022-04-20 DIAGNOSIS — R911 Solitary pulmonary nodule: Secondary | ICD-10-CM | POA: Diagnosis not present

## 2022-04-20 DIAGNOSIS — G459 Transient cerebral ischemic attack, unspecified: Secondary | ICD-10-CM | POA: Diagnosis not present

## 2022-04-20 DIAGNOSIS — L989 Disorder of the skin and subcutaneous tissue, unspecified: Secondary | ICD-10-CM | POA: Diagnosis not present

## 2022-04-20 DIAGNOSIS — E875 Hyperkalemia: Secondary | ICD-10-CM | POA: Diagnosis not present

## 2022-04-20 DIAGNOSIS — I10 Essential (primary) hypertension: Secondary | ICD-10-CM | POA: Diagnosis not present

## 2022-04-20 DIAGNOSIS — F17200 Nicotine dependence, unspecified, uncomplicated: Secondary | ICD-10-CM | POA: Diagnosis not present

## 2022-04-20 DIAGNOSIS — E785 Hyperlipidemia, unspecified: Secondary | ICD-10-CM | POA: Diagnosis not present

## 2022-05-13 ENCOUNTER — Ambulatory Visit (HOSPITAL_COMMUNITY)
Admission: RE | Admit: 2022-05-13 | Discharge: 2022-05-13 | Disposition: A | Discharge status: Home / Self Care | Payer: PPO | Source: Ambulatory Visit | Attending: Family Medicine | Admitting: Family Medicine

## 2022-05-13 DIAGNOSIS — R911 Solitary pulmonary nodule: Secondary | ICD-10-CM | POA: Diagnosis not present

## 2022-05-13 DIAGNOSIS — I700 Atherosclerosis of aorta: Secondary | ICD-10-CM | POA: Diagnosis not present

## 2022-05-13 DIAGNOSIS — J439 Emphysema, unspecified: Secondary | ICD-10-CM | POA: Diagnosis not present

## 2022-05-20 DIAGNOSIS — L218 Other seborrheic dermatitis: Secondary | ICD-10-CM | POA: Diagnosis not present

## 2022-05-20 DIAGNOSIS — L820 Inflamed seborrheic keratosis: Secondary | ICD-10-CM | POA: Diagnosis not present

## 2022-05-20 DIAGNOSIS — B078 Other viral warts: Secondary | ICD-10-CM | POA: Diagnosis not present

## 2022-06-24 DIAGNOSIS — B078 Other viral warts: Secondary | ICD-10-CM | POA: Diagnosis not present

## 2022-08-02 DIAGNOSIS — M542 Cervicalgia: Secondary | ICD-10-CM | POA: Diagnosis not present

## 2022-08-11 ENCOUNTER — Other Ambulatory Visit (HOSPITAL_COMMUNITY): Payer: Self-pay | Admitting: Nurse Practitioner

## 2022-08-11 ENCOUNTER — Ambulatory Visit (HOSPITAL_COMMUNITY)
Admission: RE | Admit: 2022-08-11 | Discharge: 2022-08-11 | Disposition: A | Discharge status: Home / Self Care | Payer: PPO | Source: Ambulatory Visit | Attending: Nurse Practitioner | Admitting: Nurse Practitioner

## 2022-08-11 DIAGNOSIS — M25552 Pain in left hip: Secondary | ICD-10-CM

## 2022-08-11 DIAGNOSIS — H04123 Dry eye syndrome of bilateral lacrimal glands: Secondary | ICD-10-CM | POA: Diagnosis not present

## 2022-08-30 DIAGNOSIS — H26491 Other secondary cataract, right eye: Secondary | ICD-10-CM | POA: Diagnosis not present

## 2022-09-09 ENCOUNTER — Encounter: Payer: Self-pay | Admitting: Orthopedic Surgery

## 2022-09-09 ENCOUNTER — Ambulatory Visit: Payer: PPO | Admitting: Orthopedic Surgery

## 2022-09-09 VITALS — BP 168/93 | HR 87 | Ht 66.0 in | Wt 149.0 lb

## 2022-09-09 DIAGNOSIS — M24852 Other specific joint derangements of left hip, not elsewhere classified: Secondary | ICD-10-CM | POA: Diagnosis not present

## 2022-09-09 NOTE — Progress Notes (Signed)
Office Visit Note   Patient: Holly Gilmore           Date of Birth: Sep 19, 1945           MRN: [id number] Visit Date: 09/09/2022 Requested by: Benita Stabile, MD [id number] 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    Subjective: Chief Complaint  Patient presents with   Hip Pain    Left groin has snapped x3 when working in the yard now has pain in left thigh     HPI: 77 year old female was sitting pruning some flowers got up felt a loud snap and pain in her left hip this happened 2 additional occasions but she treated it with Tylenol and ibuprofen.  She notes she cannot climb the steps by leading with the left leg but she is now getting better              ROS: Negative   Images personally read and my interpretation : Plain films show some exostosis around the greater trochanters right and left  Visit Diagnoses:  1. Snapping hip, left      Follow-Up Instructions: Return if symptoms worsen or fail to improve.    Objective: Vital Signs: BP (!) 168/93   Pulse 87   Ht  (1.676 m)   Wt 149 lb (67.6 kg)   BMI 24.05 kg/m   Physical Exam Vitals and nursing note reviewed.  Constitutional:      Appearance: Normal appearance.  HENT:     Head: Normocephalic and atraumatic.  Eyes:     General: No scleral icterus.       Right eye: No discharge.        Left eye: No discharge.     Extraocular Movements: Extraocular movements intact.     Conjunctiva/sclera: Conjunctivae normal.     Pupils: Pupils are equal, round, and reactive to light.  Cardiovascular:     Rate and Rhythm: Normal rate.     Pulses: Normal pulses.  Skin:    General: Skin is warm and dry.     Capillary Refill: Capillary refill takes less than 2 seconds.  Neurological:      General: No focal deficit present.     Mental Status: She is alert and oriented to person, place, and time.  Psychiatric:        Mood and Affect: Mood normal.        Behavior: Behavior normal.        Thought Content: Thought content normal.        Judgment: Judgment normal.      Right Hip Exam  Right hip exam is normal.   Tenderness  The patient is experiencing no tenderness.   Range of Motion  The patient has normal right hip ROM.  Muscle Strength  The patient has normal right hip strength.  Tests  FABER: negative  Other  Erythema: absent Sensation: normal Pulse: present  Comments:  HIP STABILITY NORMAL    Left Hip Exam  Left hip exam is normal.  Tenderness  The patient is experiencing tenderness in the greater trochanter.  Range of Motion  The patient has normal left hip ROM.  Muscle Strength  The patient has normal left hip strength.  Tests  FABER: negative  Other  Erythema: absent Sensation: normal Pulse: present  Comments:  HIP STABILITY NORMAL        Specialty Comments:  No specialty comments available.  Imaging: No results found.   PMFS History: Patient Active Problem List   Diagnosis Date Noted   Dysphagia 11/27/2021   Acute CVA (cerebrovascular accident) 08/24/2021   Hyperglycemia 08/24/2021   Tobacco abuse 08/24/2021   Essential hypertension, benign 01/06/2016   Dental infection 01/06/2016   Hyperlipidemia 01/06/2016   Heart murmur 01/06/2016   Past Medical History:  Diagnosis Date   Hepatitis A    Hypertension     Family History  Problem Relation Age of Onset   Arthritis Mother    Hypertension Mother    Cancer Father        prost   Alcohol abuse Brother     Past Surgical History:  Procedure Laterality Date   ABDOMINAL HYSTERECTOMY     CHOLECYSTECTOMY     Social History   Occupational History   Not on file  Tobacco Use   Smoking status: Every Day    Packs/day: 2.00    Years: 40.00    Additional  pack years: 0.00    Total pack years: 80.00    Types: Cigarettes    Last attempt to quit: 01/30/2016    Years since quitting: 6.6   Smokeless tobacco: Never   Tobacco comments:    patient states that she started smoking again and does not want to try to quit again at this time.  Vaping Use   Vaping Use: Never used  Substance and Sexual Activity   Alcohol use: Yes    Comment: occasional   Drug use: No   Sexual activity: Not on file

## 2022-10-19 DIAGNOSIS — E785 Hyperlipidemia, unspecified: Secondary | ICD-10-CM | POA: Diagnosis not present

## 2022-10-19 DIAGNOSIS — I10 Essential (primary) hypertension: Secondary | ICD-10-CM | POA: Diagnosis not present

## 2022-10-19 DIAGNOSIS — Z139 Encounter for screening, unspecified: Secondary | ICD-10-CM | POA: Diagnosis not present

## 2022-10-22 DIAGNOSIS — G459 Transient cerebral ischemic attack, unspecified: Secondary | ICD-10-CM | POA: Diagnosis not present

## 2022-10-22 DIAGNOSIS — I10 Essential (primary) hypertension: Secondary | ICD-10-CM | POA: Diagnosis not present

## 2022-10-22 DIAGNOSIS — F17210 Nicotine dependence, cigarettes, uncomplicated: Secondary | ICD-10-CM | POA: Diagnosis not present

## 2022-10-22 DIAGNOSIS — Z79899 Other long term (current) drug therapy: Secondary | ICD-10-CM | POA: Diagnosis not present

## 2022-10-22 DIAGNOSIS — R011 Cardiac murmur, unspecified: Secondary | ICD-10-CM | POA: Diagnosis not present

## 2022-10-22 DIAGNOSIS — Z0001 Encounter for general adult medical examination with abnormal findings: Secondary | ICD-10-CM | POA: Diagnosis not present

## 2022-10-22 DIAGNOSIS — J439 Emphysema, unspecified: Secondary | ICD-10-CM | POA: Diagnosis not present

## 2022-10-22 DIAGNOSIS — R7303 Prediabetes: Secondary | ICD-10-CM | POA: Diagnosis not present

## 2022-10-22 DIAGNOSIS — I700 Atherosclerosis of aorta: Secondary | ICD-10-CM | POA: Diagnosis not present

## 2022-10-22 DIAGNOSIS — Z0000 Encounter for general adult medical examination without abnormal findings: Secondary | ICD-10-CM | POA: Diagnosis not present

## 2022-10-22 DIAGNOSIS — E785 Hyperlipidemia, unspecified: Secondary | ICD-10-CM | POA: Diagnosis not present

## 2022-11-15 DIAGNOSIS — R059 Cough, unspecified: Secondary | ICD-10-CM | POA: Diagnosis not present

## 2022-11-15 DIAGNOSIS — Z114 Encounter for screening for human immunodeficiency virus [HIV]: Secondary | ICD-10-CM | POA: Diagnosis not present

## 2022-11-15 DIAGNOSIS — J019 Acute sinusitis, unspecified: Secondary | ICD-10-CM | POA: Diagnosis not present

## 2023-04-04 ENCOUNTER — Other Ambulatory Visit: Payer: Self-pay

## 2023-04-04 ENCOUNTER — Emergency Department (HOSPITAL_COMMUNITY)
Admission: EM | Admit: 2023-04-04 | Discharge: 2023-04-04 | Disposition: A | Discharge status: Home / Self Care | Payer: PPO | Attending: Emergency Medicine | Admitting: Emergency Medicine

## 2023-04-04 ENCOUNTER — Encounter (HOSPITAL_COMMUNITY): Payer: Self-pay

## 2023-04-04 DIAGNOSIS — Z7982 Long term (current) use of aspirin: Secondary | ICD-10-CM | POA: Diagnosis not present

## 2023-04-04 DIAGNOSIS — Z23 Encounter for immunization: Secondary | ICD-10-CM | POA: Insufficient documentation

## 2023-04-04 DIAGNOSIS — S0081XA Abrasion of other part of head, initial encounter: Secondary | ICD-10-CM | POA: Diagnosis not present

## 2023-04-04 DIAGNOSIS — S0185XA Open bite of other part of head, initial encounter: Secondary | ICD-10-CM | POA: Diagnosis not present

## 2023-04-04 DIAGNOSIS — S61451A Open bite of right hand, initial encounter: Secondary | ICD-10-CM | POA: Diagnosis not present

## 2023-04-04 DIAGNOSIS — W540XXA Bitten by dog, initial encounter: Secondary | ICD-10-CM | POA: Insufficient documentation

## 2023-04-04 DIAGNOSIS — S61411A Laceration without foreign body of right hand, initial encounter: Secondary | ICD-10-CM | POA: Diagnosis not present

## 2023-04-04 MED ORDER — METRONIDAZOLE 500 MG PO TABS: 500.0000 mg | ORAL_TABLET | Freq: Three times a day (TID) | ORAL | 0 refills | Status: AC

## 2023-04-04 MED ORDER — DOXYCYCLINE HYCLATE 100 MG PO CAPS: 100.0000 mg | ORAL_CAPSULE | Freq: Two times a day (BID) | ORAL | 0 refills | Status: DC

## 2023-04-04 MED ORDER — TETANUS-DIPHTH-ACELL PERTUSSIS 5-2.5-18.5 LF-MCG/0.5 IM SUSY
0.5000 mL | PREFILLED_SYRINGE | Freq: Once | INTRAMUSCULAR | Status: AC
  Administered 2023-04-04: 0.5 mL via INTRAMUSCULAR
  Filled 2023-04-04: qty 0.5

## 2023-04-04 MED ORDER — LIDOCAINE HCL (PF) 1 % IJ SOLN
10.0000 mL | Freq: Once | INTRAMUSCULAR | Status: AC
Start: 2023-04-04 — End: 2023-04-04
  Administered 2023-04-04: 10 mL
  Filled 2023-04-04: qty 10

## 2023-04-04 MED ORDER — METRONIDAZOLE 500 MG PO TABS
500.0000 mg | ORAL_TABLET | Freq: Once | ORAL | Status: AC
  Administered 2023-04-04: 500 mg via ORAL
  Filled 2023-04-04: qty 1

## 2023-04-04 MED ORDER — DOXYCYCLINE HYCLATE 100 MG PO TABS
100.0000 mg | ORAL_TABLET | Freq: Once | ORAL | Status: AC
  Administered 2023-04-04: 100 mg via ORAL
  Filled 2023-04-04: qty 1

## 2023-04-04 MED ORDER — POVIDONE-IODINE 10 % EX SOLN
CUTANEOUS | Status: AC
  Filled 2023-04-04: qty 29.6

## 2023-04-04 NOTE — ED Triage Notes (Addendum)
Pt comes from an animal bite to the rt hand and forehead. Pt states that she was at a good friends house and the dog thought she was taking it's bone and bit her. Bleeding controled at this time.

## 2023-04-04 NOTE — Discharge Instructions (Addendum)
Please follow-up with your primary care doctor, for suture removal.  Keep track of your wound, and make sure it is clean and dry every day, make sure you are changing your dressing every day.  If it becomes red, swollen, tender to the touch please return to the ER.  You declined imaging today, you are welcome to return, anytime, for further evaluation.  Make sure that you get your stitches removed in 7 days, and follow-up with your PCP.  Do not get the stitches wet for the first 24 hours.

## 2023-04-04 NOTE — ED Provider Notes (Signed)
[place] [facility] AT [facility] Provider Note   CSN: [id number] Arrival date & time: 04/04/23  1529     History  Chief Complaint  Patient presents with   Animal Bite    Holly Gilmore is a 77 y.o. female, history of CVA, on aspirin, who presents to the ED secondary to right hand pain, and head laceration that occurred after she was about to kiss the dog and the dog became possessive over its bone, and bit the patient.  But the patient on the right hand, and the head.  Patient denies any kind of nausea, vomiting, confusion.  Is not any blood thinners, but is on an antiplatelet.  States that she feels like her right hand is swollen.  Denies any tenderness.  States her last tetanus was 6 years ago, and she wash the area and put a bandage on it.  Dog is up-to-date on immunizations.   Home Medications Prior to Admission medications   Medication Sig Start Date End Date Taking? Authorizing Provider  doxycycline (VIBRAMYCIN) 100 MG capsule Take 1 capsule (100 mg total) by mouth 2 (two) times daily. 04/04/23  Yes Jamis Kryder L, PA  metroNIDAZOLE (FLAGYL) 500 MG tablet Take 1 tablet (500 mg total) by mouth 3 (three) times daily for 5 days. 04/04/23 04/09/23 Yes Colbert Curenton L, PA  amLODipine (NORVASC) 10 MG tablet Take 1 tablet (10 mg total) by mouth daily. 08/25/21   Azucena Fallen, MD  atorvastatin (LIPITOR) 40 MG tablet Take 1 tablet (40 mg total) by mouth daily. Patient not taking: Reported on 09/09/2022 08/25/21 08/25/22  Azucena Fallen, MD  benazepril (LOTENSIN) 20 MG tablet Take 1 tablet (20 mg total) by mouth daily. -- Office visit needed for further refills 08/25/21   Azucena Fallen, MD  cycloSPORINE (RESTASIS) 0.05 % ophthalmic emulsion 1 drop 2 (two) times daily.    [provider]  Multiple Vitamins-Minerals (MULTIVITAMIN) tablet Take 1 tablet by mouth daily. 01/06/16   Pincus Sanes, MD  Omega 3 1200 MG CAPS Takes daily Patient  taking differently: Take 1,200 mg by mouth daily. 01/06/16   Pincus Sanes, MD  vitamin C (ASCORBIC ACID) 500 MG tablet Take 1,000 mg by mouth daily.    [provider]      Allergies    Penicillins    Review of Systems   Review of Systems  Constitutional:  Negative for fever.  Skin:  Positive for wound.    Physical Exam Updated Vital Signs BP (!) 158/71 (BP Location: Left Arm)   Pulse 62   Temp 99 F (37.2 C) (Oral)   Resp 18   Ht 5\' 6"  (1.676 m)   Wt 67.6 kg   SpO2 94%   BMI 24.05 kg/m  Physical Exam Vitals and nursing note reviewed.  Constitutional:      General: She is not in acute distress.    Appearance: She is well-developed.  HENT:     Head: Normocephalic and atraumatic.  Eyes:     General:        Right eye: No discharge.        Left eye: No discharge.     Conjunctiva/sclera: Conjunctivae normal.  Pulmonary:     Effort: No respiratory distress.  Musculoskeletal:     Comments: Right hand: no ttp. +2cm laceration on dorsal aspect of R hand. Puncture wounds present. No bone visualized. Ecchymoses and edema to thenar eminence.  Radial pulses present. Grip strength intact.  Able to flex, extend, ulnar and radial deviate wrist. Two point discrimination intact. Normal thumb opposition. Intact ROM for all MCPs, PIPs, and DIPs.  No snuffbox ttp. No sensory deficits. Capillary refill <2sec   Skin:    Comments: +Dimple Bastyr abrasion/clotted puncture wounds to R forehead  Neurological:     Mental Status: She is alert.     Comments: Clear speech.   Psychiatric:        Behavior: Behavior normal.        Thought Content: Thought content normal.     ED Results / Procedures / Treatments   Labs (all labs ordered are listed, but only abnormal results are displayed) Labs Reviewed - No data to display  EKG None  Radiology No results found.  Procedures .Marland KitchenLaceration Repair  Date/Time: 04/04/2023 7:46 PM  Performed by: Pete Pelt, PA Authorized by: Pete Pelt, PA   Consent:    Consent obtained:  Verbal   Consent given by:  Patient   Risks, benefits, and alternatives were discussed: yes     Risks discussed:  Infection, need for additional repair, poor wound healing, nerve damage, poor cosmetic result, pain, retained foreign body, tendon damage and vascular damage   Alternatives discussed:  No treatment Universal protocol:    Patient identity confirmed:  Verbally with patient Anesthesia:    Anesthesia method:  Local infiltration   Local anesthetic:  Lidocaine 1% w/o epi Laceration details:    Location:  Hand   Hand location:  R hand, dorsum   Length (cm):  2 Treatment:    Area cleansed with:  Povidone-iodine   Amount of cleaning:  Extensive   Irrigation solution:  Sterile saline   Irrigation method:  Pressure wash Skin repair:    Repair method:  Sutures   Suture size:  5-0   Suture material:  Prolene   Suture technique:  Simple interrupted   Number of sutures:  2 Approximation:    Approximation:  Loose Repair type:    Repair type:  Simple Post-procedure details:    Dressing:  Non-adherent dressing   Procedure completion:  Tolerated     Medications Ordered in ED Medications  povidone-iodine (BETADINE) 10 % external solution (has no administration in time range)  Tdap (BOOSTRIX) injection 0.5 mL (0.5 mLs Intramuscular Given 04/04/23 1929)  lidocaine (PF) (XYLOCAINE) 1 % injection 10 mL (10 mLs Infiltration Given 04/04/23 1930)  doxycycline (VIBRA-TABS) tablet 100 mg (100 mg Oral Given 04/04/23 1928)  metroNIDAZOLE (FLAGYL) tablet 500 mg (500 mg Oral Given 04/04/23 1928)    ED Course/ Medical Decision Making/ A&P                                 Medical Decision Making Patient is a 77 year old female, here for dog bite, to the face, and back of the hand, that occurred a few hours ago.  She states bent down to kiss the dog, and it bit her in the face, and the right hand.  She does have extensive bruising to the right  hand no snuffbox tenderness.  I recommended an x-ray, and she declined as well as a head CT, and she declined.  We discussed risk associated with this and she voiced understanding.  Dog is up-to-date on his immunizations, however her last tetanus is 6 years ago, we will update given open wound.  The area on the hand, require stitches, given the depth of the wound,  and how it is splayed out.  2 loose stitches were placed, to help approximate the wound slightly.  We will have her follow-up with her primary care doctor, in 7 days for suture removal.  I prescribed her doxycycline, and Flagyl, given her allergy to penicillin, she states that it is a severe allergy.  We discussed return precautions she voiced understanding discharged home  Risk Prescription drug management.   Final Clinical Impression(s) / ED Diagnoses Final diagnoses:  Dog bite of face, initial encounter  Dog bite of right hand, initial encounter    Rx / DC Orders ED Discharge Orders          Ordered    doxycycline (VIBRAMYCIN) 100 MG capsule  2 times daily        04/04/23 1943    metroNIDAZOLE (FLAGYL) 500 MG tablet  3 times daily        04/04/23 1943              Pete Pelt, [place] 04/04/23 1949    Linwood Dibbles, MD 04/05/23 1126

## 2023-04-04 NOTE — ED Notes (Addendum)
[facility] states pt refuses to give any information. Pt told this nurse and [facility] that the dog was up to date on shots.

## 2023-04-04 NOTE — ED Notes (Signed)
[facility] at bedside to speak with pt.  

## 2023-04-04 NOTE — ED Notes (Signed)
ED Provider at bedside. 

## 2023-04-12 DIAGNOSIS — Z789 Other specified health status: Secondary | ICD-10-CM | POA: Diagnosis not present

## 2023-04-12 DIAGNOSIS — L03113 Cellulitis of right upper limb: Secondary | ICD-10-CM | POA: Diagnosis not present

## 2023-04-19 DIAGNOSIS — E782 Mixed hyperlipidemia: Secondary | ICD-10-CM | POA: Diagnosis not present

## 2023-04-19 DIAGNOSIS — I10 Essential (primary) hypertension: Secondary | ICD-10-CM | POA: Diagnosis not present

## 2023-04-25 DIAGNOSIS — J439 Emphysema, unspecified: Secondary | ICD-10-CM | POA: Diagnosis not present

## 2023-04-25 DIAGNOSIS — F17210 Nicotine dependence, cigarettes, uncomplicated: Secondary | ICD-10-CM | POA: Diagnosis not present

## 2023-04-25 DIAGNOSIS — M24852 Other specific joint derangements of left hip, not elsewhere classified: Secondary | ICD-10-CM | POA: Diagnosis not present

## 2023-04-25 DIAGNOSIS — R011 Cardiac murmur, unspecified: Secondary | ICD-10-CM | POA: Diagnosis not present

## 2023-04-25 DIAGNOSIS — R7303 Prediabetes: Secondary | ICD-10-CM | POA: Diagnosis not present

## 2023-04-25 DIAGNOSIS — E785 Hyperlipidemia, unspecified: Secondary | ICD-10-CM | POA: Diagnosis not present

## 2023-04-25 DIAGNOSIS — I700 Atherosclerosis of aorta: Secondary | ICD-10-CM | POA: Diagnosis not present

## 2023-04-25 DIAGNOSIS — Z79899 Other long term (current) drug therapy: Secondary | ICD-10-CM | POA: Diagnosis not present

## 2023-04-25 DIAGNOSIS — I10 Essential (primary) hypertension: Secondary | ICD-10-CM | POA: Diagnosis not present

## 2023-04-25 DIAGNOSIS — F17200 Nicotine dependence, unspecified, uncomplicated: Secondary | ICD-10-CM | POA: Diagnosis not present

## 2023-04-25 DIAGNOSIS — G459 Transient cerebral ischemic attack, unspecified: Secondary | ICD-10-CM | POA: Diagnosis not present

## 2023-07-18 ENCOUNTER — Other Ambulatory Visit (INDEPENDENT_AMBULATORY_CARE_PROVIDER_SITE_OTHER): Payer: Self-pay

## 2023-07-18 ENCOUNTER — Ambulatory Visit: Payer: PPO | Admitting: Orthopedic Surgery

## 2023-07-18 VITALS — BP 151/74 | HR 82 | Ht 66.0 in | Wt 145.0 lb

## 2023-07-18 DIAGNOSIS — M1712 Unilateral primary osteoarthritis, left knee: Secondary | ICD-10-CM | POA: Diagnosis not present

## 2023-07-18 DIAGNOSIS — G5732 Lesion of lateral popliteal nerve, left lower limb: Secondary | ICD-10-CM | POA: Diagnosis not present

## 2023-07-18 DIAGNOSIS — M25562 Pain in left knee: Secondary | ICD-10-CM

## 2023-07-18 DIAGNOSIS — G8929 Other chronic pain: Secondary | ICD-10-CM

## 2023-07-18 NOTE — Progress Notes (Signed)
  Intake history:  BP (!) 151/74   Pulse 82   Ht 5\' 6"  (1.676 m)   Wt 145 lb (65.8 kg)   BMI 23.40 kg/m  Body mass index is 23.4 kg/m.    WHAT ARE WE SEEING YOU FOR TODAY?   left knee(s)  How long has this bothered you? (DOI?DOS?WS?)  on 06/18/2023  Anticoag.  No  Diabetes No  Heart disease No  Hypertension Yes  SMOKING HX Yes  Kidney disease No  Any ALLERGIES ______________________________________________   Treatment:  Have you taken:  Tylenol No  Advil No  Had PT No  Had injection No  Other  _________________________

## 2023-07-18 NOTE — Progress Notes (Signed)
  Subjective:     Patient ID: Holly Gilmore, female   DOB: [birth year], 78 y.o.   MRN: [id number]   Chief Complaint  Patient presents with   Knee Pain    Patient in today with a knot blow the knee with pain and hot to touch  she noticed the know around 06/18/2023    Berton Mount is 78 years old she had a mass over the lateral side of her left knee which was warm to touch and painful and associated with numbness and tingling it has since gone down but she still wants it checked out  Knee Pain  Pertinent negatives include no numbness.     Review of Systems  Neurological:  Negative for tremors, weakness and numbness.       Objective:   Physical Exam Vitals and nursing note reviewed.  Constitutional:      Appearance: Normal appearance.  HENT:     Head: Normocephalic and atraumatic.  Eyes:     General: No scleral icterus.       Right eye: No discharge.        Left eye: No discharge.     Extraocular Movements: Extraocular movements intact.     Conjunctiva/sclera: Conjunctivae normal.     Pupils: Pupils are equal, round, and reactive to light.  Cardiovascular:     Rate and Rhythm: Normal rate.     Pulses: Normal pulses.  Musculoskeletal:     Comments: Left knee compared to the right knee near the peroneal nerve and fibular head there seems to be some soft tissue swelling there.  I cannot reproduce symptoms with Tinel's but when palpating area of numbness is reproduced.  Knee range of motion is normal no effusion strength muscle tone normal no weakness in the peroneal nerve musculature  Skin:    General: Skin is warm and dry.     Capillary Refill: Capillary refill takes less than 2 seconds.  Neurological:     General: No focal deficit present.     Mental Status: She is alert and oriented to person, place, and time.     Gait: Gait normal.  Psychiatric:        Mood and Affect: Mood normal.        Behavior: Behavior normal.        Thought Content: Thought content normal.         Judgment: Judgment normal.        Assessment:     Encounter Diagnoses  Name Primary?   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

## 2023-09-21 ENCOUNTER — Ambulatory Visit (INDEPENDENT_AMBULATORY_CARE_PROVIDER_SITE_OTHER): Payer: PPO | Admitting: Internal Medicine

## 2023-09-21 ENCOUNTER — Encounter: Payer: Self-pay | Admitting: Internal Medicine

## 2023-09-21 VITALS — BP 126/71 | HR 92 | Ht 66.0 in | Wt 141.0 lb

## 2023-09-21 DIAGNOSIS — Z2821 Immunization not carried out because of patient refusal: Secondary | ICD-10-CM

## 2023-09-21 DIAGNOSIS — R739 Hyperglycemia, unspecified: Secondary | ICD-10-CM

## 2023-09-21 DIAGNOSIS — I10 Essential (primary) hypertension: Secondary | ICD-10-CM | POA: Diagnosis not present

## 2023-09-21 DIAGNOSIS — Z1159 Encounter for screening for other viral diseases: Secondary | ICD-10-CM | POA: Diagnosis not present

## 2023-09-21 DIAGNOSIS — Z8673 Personal history of transient ischemic attack (TIA), and cerebral infarction without residual deficits: Secondary | ICD-10-CM | POA: Insufficient documentation

## 2023-09-21 DIAGNOSIS — Z720 Tobacco use: Secondary | ICD-10-CM

## 2023-09-21 DIAGNOSIS — E782 Mixed hyperlipidemia: Secondary | ICD-10-CM

## 2023-09-21 DIAGNOSIS — J309 Allergic rhinitis, unspecified: Secondary | ICD-10-CM | POA: Diagnosis not present

## 2023-09-21 MED ORDER — BENAZEPRIL HCL 20 MG PO TABS: 20.0000 mg | ORAL_TABLET | Freq: Every day | ORAL | 3 refills | Status: DC

## 2023-09-21 MED ORDER — FLUTICASONE PROPIONATE 50 MCG/ACT NA SUSP: 2.0000 | Freq: Every day | NASAL | 1 refills | Status: AC

## 2023-09-21 MED ORDER — AMLODIPINE BESYLATE 5 MG PO TABS: 5.0000 mg | ORAL_TABLET | Freq: Every day | ORAL | 3 refills | Status: AC

## 2023-09-21 NOTE — Assessment & Plan Note (Signed)
 Well controlled with Flonase, refilled

## 2023-09-21 NOTE — Assessment & Plan Note (Signed)
 Last lipid profile reviewed from [facility] data Has elevated LDL Denies to take statin Advised to follow DASH diet

## 2023-09-21 NOTE — Assessment & Plan Note (Addendum)
 BP Readings from Last 1 Encounters:  09/21/23 126/71   Well-controlled with amlodipine  5 mg QD and benazepril  20 mg QD, refilled Counseled for compliance with the medications Advised DASH diet and moderate exercise/walking as tolerated

## 2023-09-21 NOTE — Progress Notes (Signed)
 New Patient Office Visit  Subjective:  Patient ID: Holly Gilmore, female    DOB: 01/15/1946  Age: 78 y.o. MRN: [id number]  CC:  Chief Complaint  Patient presents with   Establish Care    New patient establishing care, would like a refill on nasal spray.     HPI Holly Gilmore is a 78 y.o. female with past medical history of HTN, TIA, HLD, allergic sinusitis and tobacco abuse who presents for establishing care.  HTN: BP is well-controlled. Takes amlodipine  5 mg QD and benazepril  20 mg QD regularly. Patient denies headache, dizziness, chest pain, dyspnea or palpitations.  History of TIA: She had TIA in 2023, was admitted at [facility].  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  She was placed on aspirin  and statin, but did not continue statin.  After discussion today, she still denies to start statin.  Her last lipid profile in 12/24 showed elevated LDL ~ 130.  She understands the risk of recurrent CVA.  Allergic rhinitis/sinusitis: She has chronic nasal congestion and rhinorrhea.  She uses Flonase for it, and requests refill of it.  She smokes about 1.5 pack/day, has smoked for 60 years. She has mild cough at times, but denies any dyspnea or wheezing currently.  She has albuterol  inhaler, but rarely needs it.  She denies lung cancer screening today.  She denies pneumococcal vaccine today as well.  She lives by herself, her son spends half of the week with her.  She is independent with her ADLs and IADLs.   Past Medical History:  Diagnosis Date   Hepatitis A    Hypertension     Past Surgical History:  Procedure Laterality Date   ABDOMINAL HYSTERECTOMY     CHOLECYSTECTOMY      Family History  Problem Relation Age of Onset   Arthritis Mother    Hypertension Mother    Cancer Father        prost   Alcohol abuse Brother     Social History   Socioeconomic History   Marital status: Widowed    Spouse name: Not on  file   Number of children: Not on file   Years of education: Not on file   Highest education level: Not on file  Occupational History   Not on file  Tobacco Use   Smoking status: Every Day    Current packs/day: 0.00    Average packs/day: 2.0 packs/day for 40.0 years (80.0 ttl pk-yrs)    Types: Cigarettes    Start date: 01/30/1976    Last attempt to quit: 01/30/2016    Years since quitting: 7.6   Smokeless tobacco: Never   Tobacco comments:    1 1/2 ppd as of 09/21/2023  Vaping Use   Vaping status: Never Used  Substance and Sexual Activity   Alcohol use: Yes    Comment: occasional   Drug use: No   Sexual activity: Not on file  Other Topics Concern   Not on file  Social History Narrative   No regular exercise   Gardening, canning   Social Drivers of Health   Financial Resource Strain: Not on file  Food Insecurity: Not on file  Transportation Needs: Not on file  Physical Activity: Not on file  Stress: Not on file  Social Connections: Not on file  Intimate Partner Violence: Not on file    ROS Review of Systems  Constitutional:  Negative for chills and fever.  HENT:  Positive for congestion and rhinorrhea. Negative for sore throat.   Eyes:  Negative for pain and discharge.  Respiratory:  Positive for cough (Mild, chronic). Negative for shortness of breath.   Cardiovascular:  Negative for chest pain and palpitations.  Gastrointestinal:  Negative for abdominal pain, diarrhea, nausea and vomiting.  Endocrine: Negative for polydipsia and polyuria.  Genitourinary:  Negative for dysuria and hematuria.  Musculoskeletal:  Negative for neck pain and neck stiffness.  Skin:  Negative for rash.  Neurological:  Negative for dizziness and weakness.  Psychiatric/Behavioral:  Negative for agitation and behavioral problems.     Objective:   Today's Vitals: BP 126/71   Pulse 92   Ht 5\' 6"  (1.676 m)   Wt 141 lb (64 kg)   SpO2 91%   BMI 22.76 kg/m   Physical Exam Vitals  reviewed.  Constitutional:      General: She is not in acute distress.    Appearance: She is not diaphoretic.  HENT:     Head: Normocephalic and atraumatic.     Nose: Congestion present.     Mouth/Throat:     Mouth: Mucous membranes are moist.  Eyes:     General: No scleral icterus.    Extraocular Movements: Extraocular movements intact.  Cardiovascular:     Rate and Rhythm: Normal rate and regular rhythm.     Heart sounds: Normal heart sounds. No murmur heard. Pulmonary:     Breath sounds: Normal breath sounds. No wheezing or rales.  Musculoskeletal:     Cervical back: Neck supple. No tenderness.     Right lower leg: No edema.     Left lower leg: No edema.  Skin:    General: Skin is warm.     Findings: No rash.  Neurological:     General: No focal deficit present.     Mental Status: She is alert and oriented to person, place, and time.  Psychiatric:        Mood and Affect: Mood normal.        Behavior: Behavior normal.     Assessment & Plan:   Problem List Items Addressed This Visit       Cardiovascular and Mediastinum   Essential hypertension, benign - Primary   BP Readings from Last 1 Encounters:  09/21/23 126/71   Well-controlled with amlodipine  5 mg QD and benazepril  20 mg QD, refilled Counseled for compliance with the medications Advised DASH diet and moderate exercise/walking as tolerated       Relevant Medications   amLODipine  (NORVASC ) 5 MG tablet   benazepril  (LOTENSIN ) 20 MG tablet   Other Relevant Orders   CBC with Differential/Platelet   CMP14+EGFR     Respiratory   Allergic sinusitis   Well controlled with Flonase, refilled      Relevant Medications   fluticasone (FLONASE) 50 MCG/ACT nasal spray     Other   Hyperlipidemia   Last lipid profile reviewed from [facility] data Has elevated LDL Denies to take statin Advised to follow DASH diet      Relevant Medications   amLODipine  (NORVASC ) 5 MG tablet   benazepril  (LOTENSIN ) 20 MG tablet    Other Relevant Orders   Lipid Profile   Hyperglycemia   Relevant Orders   Hemoglobin A1c   Tobacco abuse   Smokes about 1.5 pack/day  Asked about quitting: confirms that he/she currently smokes cigarettes Advise to quit smoking: Educated about QUITTING to reduce the risk of cancer, cardio and cerebrovascular disease. Assess willingness: Unwilling  to quit at this time, but is working on cutting back. Assist with counseling and pharmacotherapy: Counseled for 5 minutes and literature provided. Arrange for follow up: follow up in 3 months and continue to offer help.      History of CVA (cerebrovascular accident)   Had TIA in 2023 On aspirin  81 mg once daily Denies to take statin despite counseling, understands risk of recurrent CVA Strongly emphasized importance to quit smoking      Other Visit Diagnoses       Need for hepatitis C screening test       Relevant Orders   Hepatitis C Antibody       Outpatient Encounter Medications as of 09/21/2023  Medication Sig   Cholecalciferol (VITAMIN D-3 PO) Take by mouth.   cycloSPORINE  (RESTASIS ) 0.05 % ophthalmic emulsion 1 drop 2 (two) times daily.   fluticasone (FLONASE) 50 MCG/ACT nasal spray Place 2 sprays into both nostrils daily.   Multiple Vitamins-Minerals (MULTIVITAMIN) tablet Take 1 tablet by mouth daily.   Omega 3 1200 MG CAPS Takes daily (Patient taking differently: Take 1,200 mg by mouth daily.)   vitamin C (ASCORBIC ACID) 500 MG tablet Take 1,000 mg by mouth daily.   [DISCONTINUED] amLODipine  (NORVASC ) 10 MG tablet Take 1 tablet (10 mg total) by mouth daily.   [DISCONTINUED] benazepril  (LOTENSIN ) 20 MG tablet Take 1 tablet (20 mg total) by mouth daily. -- Office visit needed for further refills (Patient taking differently: Take 10 mg by mouth daily. -- Office visit needed for further refills)   amLODipine  (NORVASC ) 5 MG tablet Take 1 tablet (5 mg total) by mouth daily.   benazepril  (LOTENSIN ) 20 MG tablet Take 1 tablet (20  mg total) by mouth daily.   [DISCONTINUED] atorvastatin  (LIPITOR) 40 MG tablet Take 1 tablet (40 mg total) by mouth daily. (Patient not taking: Reported on 09/09/2022)   [DISCONTINUED] doxycycline  (VIBRAMYCIN ) 100 MG capsule Take 1 capsule (100 mg total) by mouth 2 (two) times daily.   No facility-administered encounter medications on file as of 09/21/2023.    Follow-up: Return in about 5 months (around 02/21/2024) for HTN and HLD.   Meldon Sport, MD

## 2023-09-21 NOTE — Assessment & Plan Note (Signed)
Smokes about 1.5 pack/day  Asked about quitting: confirms that he/she currently smokes cigarettes Advise to quit smoking: Educated about QUITTING to reduce the risk of cancer, cardio and cerebrovascular disease. Assess willingness: Unwilling to quit at this time, but is working on cutting back. Assist with counseling and pharmacotherapy: Counseled for 5 minutes and literature provided. Arrange for follow up: follow up in 3 months and continue to offer help.

## 2023-09-21 NOTE — Assessment & Plan Note (Signed)
 Had TIA in 2023 On aspirin  81 mg once daily Denies to take statin despite counseling, understands risk of recurrent CVA Strongly emphasized importance to quit smoking

## 2023-09-21 NOTE — Patient Instructions (Signed)
 Please continue to take medications as prescribed.  Please continue to follow DASH diet and perform moderate exercise/walking at least 150 mins/week.  Please get fasting blood tests done before the next visit.

## 2023-09-30 ENCOUNTER — Other Ambulatory Visit (INDEPENDENT_AMBULATORY_CARE_PROVIDER_SITE_OTHER): Payer: Self-pay

## 2023-09-30 ENCOUNTER — Ambulatory Visit: Admitting: Orthopedic Surgery

## 2023-09-30 ENCOUNTER — Encounter: Payer: Self-pay | Admitting: Orthopedic Surgery

## 2023-09-30 VITALS — BP 126/76 | Ht 66.0 in | Wt 141.0 lb

## 2023-09-30 DIAGNOSIS — M25572 Pain in left ankle and joints of left foot: Secondary | ICD-10-CM

## 2023-09-30 DIAGNOSIS — M7662 Achilles tendinitis, left leg: Secondary | ICD-10-CM

## 2023-09-30 MED ORDER — MELOXICAM 7.5 MG PO TABS: 7.5000 mg | ORAL_TABLET | Freq: Every day | ORAL | 5 refills | Status: AC

## 2023-09-30 NOTE — Patient Instructions (Addendum)
 Dx achilles tendonitis  Retrocalcaneal bursitis   Pump BUmp   Ice 2-3 times daily for 20-30 minutes  Physical therapy has been ordered for you at [facility]. They should call you to schedule, [phone] is the phone number to call, if you want to call to schedule.

## 2023-09-30 NOTE — Progress Notes (Signed)
  Intake history:  BP 126/76 Comment: 09/21/23  Ht 5\' 6"  (1.676 m)   Wt 141 lb (64 kg)   BMI 22.76 kg/m  Body mass index is 22.76 kg/m.    WHAT ARE WE SEEING YOU FOR TODAY?   left foot/feet was swollen earlier this week   How long has this bothered you? (DOI?DOS?WS?)  approximately 5 weeks) ago  Anticoag.  No  Diabetes No  Heart disease No  Hypertension Yes  SMOKING HX Yes  Kidney disease No  Any ALLERGIES ______________________________________________   Treatment:  Have you taken:  Tylenol  Yes  Advil Yes   Had PT No  Had injection No  Other  _________________________

## 2023-09-30 NOTE — Progress Notes (Signed)
  Intake history:  BP 126/76 Comment: 09/21/23  Ht 5\' 6"  (1.676 m)   Wt 141 lb (64 kg)   BMI 22.76 kg/m  Body mass index is 22.76 kg/m.    WHAT ARE WE SEEING YOU FOR TODAY?   left foot/feet was swollen earlier this week   How long has this bothered you? (DOI?DOS?WS?)  approximately 5 weeks) ago  Anticoag.  No  Diabetes No  Heart disease No  Hypertension Yes  SMOKING HX Yes  Kidney disease No  Any ALLERGIES ______________________________________________   Treatment:  Have you taken:  Tylenol  Yes  Advil Yes   Had PT No  Had injection No  Other  _________________________   History noted above.  78 year old female pain left ankle and foot primarily around the Achilles and posterior calcaneus no history of trauma.  It sounds like her activities include a frequent amount of stair climbing  Review of systems the pain she was having in her knee that shot down her leg no longer present  Exam shows no noticeable limp  The left foot has a swollen tender area consistent with a pump bump and also swelling anterior to the Achilles tendon consistent with bursal swelling.  The tendon itself is tender but intact.  There is no strength deficit and there is no instability of the ankle  X-rays as follows DG Foot Complete Left Result Date: 09/30/2023 Left foot x-ray Heel pain X-ray shows prominent dorsal calcaneal area.  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 Impression prominent calcaneus can lead to pump bump syndrome Achilles tendinosis tendinitis     Encounter Diagnoses  Name Primary?   Pain in left ankle and joints of left foot    Insertional tendinopathy of left Achilles tendon Yes     Recommend   Ice CAM Walker Short, patient declined Physical therapy Anti-inflammatories Check 6 weeks

## 2023-10-28 NOTE — Therapy (Unsigned)
 OUTPATIENT PHYSICAL THERAPY LOWER EXTREMITY EVALUATION   Patient Name: Holly Gilmore MRN: [id number] DOB:12-18-1945, 79 y.o., female Today's Date: 10/31/2023  END OF SESSION:  PT End of Session - 10/31/23 1301     Visit Number 1    Number of Visits 5    Date for PT Re-Evaluation 01/02/24    Authorization Type [employer]    Authorization Time Period No auth    Progress Note Due on Visit 5    PT Start Time 1303    PT Stop Time 1345    PT Time Calculation (min) 42 min    Activity Tolerance Patient tolerated treatment well    Behavior During Therapy WFL for tasks assessed/performed          Past Medical History:  Diagnosis Date   Hepatitis A    Hypertension    Past Surgical History:  Procedure Laterality Date   ABDOMINAL HYSTERECTOMY     CHOLECYSTECTOMY     Patient Active Problem List   Diagnosis Date Noted   Allergic sinusitis 09/21/2023   History of CVA (cerebrovascular accident) 09/21/2023   Dysphagia 11/27/2021   Hyperglycemia 08/24/2021   Tobacco abuse 08/24/2021   Essential hypertension, benign 01/06/2016   Hyperlipidemia 01/06/2016   Heart murmur 01/06/2016    PCP: Meldon Sport, MD  REFERRING PROVIDER: Darrin Emerald, MD  REFERRING DIAG: [phone] (ICD-10-CM) - Pain in left ankle and joints of left foot M76.62 (ICD-10-CM) - Insertional tendinopathy of left Achilles tendon  THERAPY DIAG:  Decreased range of motion of left ankle  Impaired functional mobility, balance, gait, and endurance  Pain in left ankle and joints of left foot  Rationale for Evaluation and Treatment: Rehabilitation  ONSET DATE: About a month and a half  SUBJECTIVE:   SUBJECTIVE STATEMENT: Patient reports L ankle pain began about a month and a half ago. Reports she thinks it was due to her new sketchers, felt they were putting more pressure on her feet and back of ankle. Hasn't been able to wear them since. Reports inc difficulty with prolonged walking  sometimes and increased pain when walking barefoot. Some pain when walking with shoes as well but not as bad as bare foot.    PERTINENT HISTORY: L Rotator cuff injury/surgery PAIN:  Are you having pain? Yes: NPRS scale: 3/10 Pain location: Lateral L ankle Pain description: Uneasiness Aggravating factors: Walking, crossing at ankles when laying down,  Relieving factors: Pain medication, Ice  PRECAUTIONS: None  RED FLAGS: None   WEIGHT BEARING RESTRICTIONS: No  FALLS:  Has patient fallen in last 6 months? No  LIVING ENVIRONMENT: Stairs: Yes: Internal: 12 steps; can reach both  OCCUPATION: Retired, Administration   PLOF: Independent  PATIENT GOALS: Would like for this to be normal again  NEXT MD VISIT: 3 months  OBJECTIVE:  Note: Objective measures were completed at Evaluation unless otherwise noted.  DIAGNOSTIC FINDINGS:  Heel pain   X-ray shows prominent dorsal calcaneal area.  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   Impression prominent calcaneus can lead to pump bump syndrome Achilles tendinosis tendinitis  PATIENT SURVEYS:  Lower Extremity Functional Score: 76 / 80 = 95.0 %  COGNITION: Overall cognitive status: Within functional limits for tasks assessed     SENSATION: WFL   POSTURE: rounded shoulders and forward head  PALPATION: Tenderness with moderate palpation in inferior and posterior areas of lateral and medial malleoli on L ankle Tightness noted in L Achilles   LOWER  EXTREMITY ROM:  Active ROM Right eval Left eval  Hip flexion    Hip extension    Hip abduction    Hip adduction    Hip internal rotation    Hip external rotation    Knee flexion    Knee extension    Ankle dorsiflexion  12  Ankle plantarflexion  60  Ankle inversion    Ankle eversion     (Blank rows = not tested)  LOWER EXTREMITY MMT:  MMT Right eval Left eval  Hip flexion    Hip extension    Hip abduction    Hip adduction    Hip internal  rotation    Hip external rotation    Knee flexion  4-  Knee extension  4+  Ankle dorsiflexion  5  Ankle plantarflexion  5  Ankle inversion    Ankle eversion     (Blank rows = not tested)  LOWER EXTREMITY SPECIAL TESTS:    FUNCTIONAL TESTS:  30 seconds chair stand test 2 minute walk test: 435' 30 seconds chair stand test: 11 STS, no UE use  GAIT: Distance walked: 435 Assistive device utilized: None Level of assistance: Complete Independence Comments: Inc forward lean/kyphosis, dec hip ext bilat, mild R/L drifting throughout                                                                                                                                 TREATMENT DATE:  10/31/23: PT Eval and HEP    PATIENT EDUCATION:  Education details: PT evaluation, objective findings, POC, Importance of HEP, Precautions, Clinic policies Person educated: Patient Education method: Explanation and Demonstration Education comprehension: verbalized understanding and returned demonstration  HOME EXERCISE PROGRAM: Access Code: [email or web address] URL: [email or web address] Date: 10/31/2023 Prepared by: Virgia Griffins Powell-Butler  Exercises - Seated Heel Slide  - 2 x daily - 7 x weekly - 3 sets - 10 reps - Heel Raises with Counter Support  - 2 x daily - 7 x weekly - 3 sets - 10 reps - Toe Raises with Counter Support  - 2 x daily - 7 x weekly - 3 sets - 10 reps - Standing Knee Flexion AROM with Chair Support  - 2 x daily - 7 x weekly - 3 sets - 10 reps  ASSESSMENT:  CLINICAL IMPRESSION: Patient is a 78 y.o. female who was seen today for physical therapy evaluation and treatment for M25.572 (ICD-10-CM) - Pain in left ankle and joints of left foot M76.62 (ICD-10-CM) - Insertional tendinopathy of left Achilles tendon. Patient arrives to PT evaluation with reports of inc pain in L ankle at times, especially with prolonged walking and standing. Patient demonstrated dec DF ROM of L ankle, dec hamstring  strength, impaired posture, and some mild balance impairments during ambulation that may all be contributing to patient pain. Recommendation given for patient to ice and elevate L ankle at end of day to alleviate some mild swelling that occurs.  Patient will benefit from skilled physical therapy in order to address the above to improve function and QOL.    OBJECTIVE IMPAIRMENTS: Abnormal gait, decreased activity tolerance, decreased balance, decreased endurance, decreased mobility, decreased ROM, decreased strength, increased edema, postural dysfunction, and pain.   ACTIVITY LIMITATIONS: standing, stairs, transfers, and locomotion level  PARTICIPATION LIMITATIONS: community activity and yard work  PERSONAL FACTORS: N/A are also affecting patient's functional outcome.   REHAB POTENTIAL: Good  CLINICAL DECISION MAKING: Stable/uncomplicated  EVALUATION COMPLEXITY: Low   GOALS: Goals reviewed with patient? No  SHORT TERM GOALS: Target date: 11/14/23 Patient will be independent with performance of HEP to demonstrate adequate self management of symptoms.  Baseline:  Goal status: INITIAL  2.   Patient will report at least a 25% improvement with function or pain overall since beginning PT. Baseline:  Goal status: INITIAL;   LONG TERM GOALS: Target date: 11/28/23 Patient will improve LEFS score by 9 points to demonstrate improved perceived function while meeting MCID.  Baseline: Goal status: INITIAL 2.  Patient will improve L ankle DF ROM to at least 20 degrees to show improved ankle mobility for improve functional transfers and QOL.  Baseline:  Goal status: INITIAL 3.  Patient will improve 30 seconds chair stand test by at least 2 STS in order to demonstrate improved LE strength and endurance needed for functional activities such as gardening.  Baseline:  Goal status: INITIAL   4.  Patient will report pain level of 2/10 or less during and after walking about a mile in order to ambulate  community distances with ease. Baseline: Goal status: INITIAL    PLAN:  PT FREQUENCY: 1x/week  PT DURATION: 4 weeks  PLANNED INTERVENTIONS: [id number]- PT Re-evaluation, 97110-Therapeutic exercises, 97530- Therapeutic activity, [id number]- Neuromuscular re-education, 97535- Self Care, [id number]- Manual therapy, [id number]- Gait training, [phone]- Electrical stimulation (manual), [phone] (1-2 muscles), [id number] (3+ muscles)- Dry Needling, Patient/Family education, Balance training, Stair training, Taping, Joint mobilization, Spinal mobilization, Cryotherapy, and Moist heat  PLAN FOR NEXT SESSION: cont progressing L ankle mobility, general LE strengthening, intro balance   4:01 PM, 10/31/23 Chanee Henrickson Powell-Butler, PT, DPT [facility] [place]

## 2023-10-31 ENCOUNTER — Ambulatory Visit (HOSPITAL_COMMUNITY): Attending: Orthopedic Surgery

## 2023-10-31 ENCOUNTER — Encounter (HOSPITAL_COMMUNITY): Payer: Self-pay

## 2023-10-31 ENCOUNTER — Other Ambulatory Visit: Payer: Self-pay

## 2023-10-31 DIAGNOSIS — M25672 Stiffness of left ankle, not elsewhere classified: Secondary | ICD-10-CM | POA: Diagnosis not present

## 2023-10-31 DIAGNOSIS — Z7409 Other reduced mobility: Secondary | ICD-10-CM | POA: Insufficient documentation

## 2023-10-31 DIAGNOSIS — M7662 Achilles tendinitis, left leg: Secondary | ICD-10-CM | POA: Insufficient documentation

## 2023-10-31 DIAGNOSIS — M25572 Pain in left ankle and joints of left foot: Secondary | ICD-10-CM | POA: Insufficient documentation

## 2023-11-11 ENCOUNTER — Ambulatory Visit: Admitting: Orthopedic Surgery

## 2023-11-17 ENCOUNTER — Ambulatory Visit (HOSPITAL_COMMUNITY): Attending: Orthopedic Surgery

## 2023-11-17 ENCOUNTER — Encounter (HOSPITAL_COMMUNITY): Payer: Self-pay

## 2023-11-17 DIAGNOSIS — Z7409 Other reduced mobility: Secondary | ICD-10-CM | POA: Insufficient documentation

## 2023-11-17 DIAGNOSIS — M25572 Pain in left ankle and joints of left foot: Secondary | ICD-10-CM | POA: Insufficient documentation

## 2023-11-17 DIAGNOSIS — M25672 Stiffness of left ankle, not elsewhere classified: Secondary | ICD-10-CM | POA: Diagnosis not present

## 2023-11-17 NOTE — Therapy (Signed)
 OUTPATIENT PHYSICAL THERAPY LOWER EXTREMITY EVALUATION   Patient Name: Holly Gilmore MRN: [id number] DOB:10/02/1945, 78 y.o., female Today's Date: 11/17/2023  END OF SESSION:  PT End of Session - 11/17/23 1521     Visit Number 2    Number of Visits 5    Date for PT Re-Evaluation 11/28/23    Authorization Type [employer]    Authorization Time Period No auth    Progress Note Due on Visit 5          Past Medical History:  Diagnosis Date   Hepatitis A    Hypertension    Past Surgical History:  Procedure Laterality Date   ABDOMINAL HYSTERECTOMY     CHOLECYSTECTOMY     Patient Active Problem List   Diagnosis Date Noted   Allergic sinusitis 09/21/2023   History of CVA (cerebrovascular accident) 09/21/2023   Dysphagia 11/27/2021   Hyperglycemia 08/24/2021   Tobacco abuse 08/24/2021   Essential hypertension, benign 01/06/2016   Hyperlipidemia 01/06/2016   Heart murmur 01/06/2016    PCP: Tobie Suzzane POUR, MD  REFERRING PROVIDER: Margrette Taft BRAVO, MD  REFERRING DIAG: [phone] (ICD-10-CM) - Pain in left ankle and joints of left foot M76.62 (ICD-10-CM) - Insertional tendinopathy of left Achilles tendon  THERAPY DIAG:  Decreased range of motion of left ankle  Impaired functional mobility, balance, gait, and endurance  Pain in left ankle and joints of left foot  Rationale for Evaluation and Treatment: Rehabilitation  ONSET DATE: About a month and a half  SUBJECTIVE:   SUBJECTIVE STATEMENT: 11/17/23:  L foot/ankle is feeling good.  Only pain while walking barefoot.  Has began exercises, would like to review the exercises.  Eval:  Patient reports L ankle pain began about a month and a half ago. Reports she thinks it was due to her new sketchers, felt they were putting more pressure on her feet and back of ankle. Hasn't been able to wear them since. Reports inc difficulty with prolonged walking sometimes and increased pain when walking barefoot. Some  pain when walking with shoes as well but not as bad as bare foot.    PERTINENT HISTORY: L Rotator cuff injury/surgery PAIN:  Are you having pain? Yes: NPRS scale: 0/10 Pain location: Lateral L ankle Pain description: Uneasiness Aggravating factors: Walking, crossing at ankles when laying down,  Relieving factors: Pain medication, Ice  PRECAUTIONS: None  RED FLAGS: None   WEIGHT BEARING RESTRICTIONS: No  FALLS:  Has patient fallen in last 6 months? No  LIVING ENVIRONMENT: Stairs: Yes: Internal: 12 steps; can reach both  OCCUPATION: Retired, Administration   PLOF: Independent  PATIENT GOALS: Would like for this to be normal again  NEXT MD VISIT: 3 months  OBJECTIVE:  Note: Objective measures were completed at Evaluation unless otherwise noted.  DIAGNOSTIC FINDINGS:  Heel pain   X-ray shows prominent dorsal calcaneal area.  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   Impression prominent calcaneus can lead to pump bump syndrome Achilles tendinosis tendinitis  PATIENT SURVEYS:  Lower Extremity Functional Score: 76 / 80 = 95.0 %  COGNITION: Overall cognitive status: Within functional limits for tasks assessed     SENSATION: WFL   POSTURE: rounded shoulders and forward head  PALPATION: Tenderness with moderate palpation in inferior and posterior areas of lateral and medial malleoli on L ankle Tightness noted in L Achilles   LOWER EXTREMITY ROM:  Active ROM Right eval Left eval  Hip flexion    Hip  extension    Hip abduction    Hip adduction    Hip internal rotation    Hip external rotation    Knee flexion    Knee extension    Ankle dorsiflexion  12  Ankle plantarflexion  60  Ankle inversion    Ankle eversion     (Blank rows = not tested)  LOWER EXTREMITY MMT:  MMT Right eval Left eval  Hip flexion    Hip extension    Hip abduction    Hip adduction    Hip internal rotation    Hip external rotation    Knee flexion  4-   Knee extension  4+  Ankle dorsiflexion  5  Ankle plantarflexion  5  Ankle inversion    Ankle eversion     (Blank rows = not tested)  LOWER EXTREMITY SPECIAL TESTS:    FUNCTIONAL TESTS:  30 seconds chair stand test 2 minute walk test: 435' 30 seconds chair stand test: 11 STS, no UE use  GAIT: Distance walked: 435 Assistive device utilized: None Level of assistance: Complete Independence Comments: Inc forward lean/kyphosis, dec hip ext bilat, mild R/L drifting throughout                                                                                                                                 TREATMENT DATE:  11/17/23:   Reviewed goals Educated importance of HEP compliance for maximal benefits PT able to recall:  Standing:   Heel raise   Toe raises   Hamstring curls  Seated heel slide  Standing:  SLS Rt 9, Lt 6  Slant board 1x 30  Gastroc stretch 2x 30  Abduction 2x 10 STS with GTB around thigh to address valgus   10/31/23: PT Eval and HEP    PATIENT EDUCATION:  Education details: PT evaluation, objective findings, POC, Importance of HEP, Precautions, Clinic policies Person educated: Patient Education method: Explanation and Demonstration Education comprehension: verbalized understanding and returned demonstration  HOME EXERCISE PROGRAM: Access Code: [email or web address] URL: [email or web address] Date: 10/31/2023 Prepared by: Rosaria Powell-Butler  Exercises - Seated Heel Slide  - 2 x daily - 7 x weekly - 3 sets - 10 reps - Heel Raises with Counter Support  - 2 x daily - 7 x weekly - 3 sets - 10 reps - Toe Raises with Counter Support  - 2 x daily - 7 x weekly - 3 sets - 10 reps - Standing Knee Flexion AROM with Chair Support  - 2 x daily - 7 x weekly - 3 sets - 10 reps  11/17/23: - Single Leg Stance  - 2 x daily - 7 x weekly - 1 sets - 3 reps - 30 hold - Standing Hip Abduction with Unilateral Counter Support  - 2 x daily - 7 x weekly - 1 sets -  10 reps - 5 hold - Gastroc Stretch on Wall  - 2 x daily - 7  x weekly - 1 sets - 3 reps - 30 hold  ASSESSMENT:  CLINICAL IMPRESSION: 11/17/23:  Reviewed goals and educated importance of HEP compliance.  Pt able to recall current exercise program with some cueing to improve mechanics with hamstring curls.  Session focus with LE strengthening, balance and mobility.  Additional hip strengthening exercises complete to assist with balance and reducing knee valgus with STS.  Pt most challenged with SLS.  Additional exercises added to HEP with printout given and encouraged to complete near counter for safety.  Pt able to complete all exercises with no reports of increased pain.    Eval:  Patient is a 78 y.o. female who was seen today for physical therapy evaluation and treatment for M25.572 (ICD-10-CM) - Pain in left ankle and joints of left foot M76.62 (ICD-10-CM) - Insertional tendinopathy of left Achilles tendon. Patient arrives to PT evaluation with reports of inc pain in L ankle at times, especially with prolonged walking and standing. Patient demonstrated dec DF ROM of L ankle, dec hamstring strength, impaired posture, and some mild balance impairments during ambulation that may all be contributing to patient pain. Recommendation given for patient to ice and elevate L ankle at end of day to alleviate some mild swelling that occurs. Patient will benefit from skilled physical therapy in order to address the above to improve function and QOL.    OBJECTIVE IMPAIRMENTS: Abnormal gait, decreased activity tolerance, decreased balance, decreased endurance, decreased mobility, decreased ROM, decreased strength, increased edema, postural dysfunction, and pain.   ACTIVITY LIMITATIONS: standing, stairs, transfers, and locomotion level  PARTICIPATION LIMITATIONS: community activity and yard work  PERSONAL FACTORS: N/A are also affecting patient's functional outcome.   REHAB POTENTIAL: Good  CLINICAL DECISION  MAKING: Stable/uncomplicated  EVALUATION COMPLEXITY: Low   GOALS: Goals reviewed with patient? No  SHORT TERM GOALS: Target date: 11/14/23 Patient will be independent with performance of HEP to demonstrate adequate self management of symptoms.  Baseline:  Goal status: INITIAL  2.   Patient will report at least a 25% improvement with function or pain overall since beginning PT. Baseline:  Goal status: INITIAL;   LONG TERM GOALS: Target date: 11/28/23 Patient will improve LEFS score by 9 points to demonstrate improved perceived function while meeting MCID.  Baseline: Goal status: INITIAL 2.  Patient will improve L ankle DF ROM to at least 20 degrees to show improved ankle mobility for improve functional transfers and QOL.  Baseline:  Goal status: INITIAL 3.  Patient will improve 30 seconds chair stand test by at least 2 STS in order to demonstrate improved LE strength and endurance needed for functional activities such as gardening.  Baseline:  Goal status: INITIAL   4.  Patient will report pain level of 2/10 or less during and after walking about a mile in order to ambulate community distances with ease. Baseline: Goal status: INITIAL    PLAN:  PT FREQUENCY: 1x/week  PT DURATION: 4 weeks  PLANNED INTERVENTIONS: 97164- PT Re-evaluation, 97110-Therapeutic exercises, 97530- Therapeutic activity, 97112- Neuromuscular re-education, 97535- Self Care, 02859- Manual therapy, [phone]- Gait training, [phone]- Electrical stimulation (manual), [phone] (1-2 muscles), [phone] (3+ muscles)- Dry Needling, Patient/Family education, Balance training, Stair training, Taping, Joint mobilization, Spinal mobilization, Cryotherapy, and Moist heat  PLAN FOR NEXT SESSION: cont progressing L ankle mobility, general LE strengthening, intro balance  Augustin Mclean, LPTA/CLT; CBIS [phone]  3:22 PM, 11/17/23

## 2023-11-23 ENCOUNTER — Ambulatory Visit (HOSPITAL_COMMUNITY)

## 2023-11-23 ENCOUNTER — Encounter (HOSPITAL_COMMUNITY): Payer: Self-pay

## 2023-11-23 DIAGNOSIS — M25672 Stiffness of left ankle, not elsewhere classified: Secondary | ICD-10-CM | POA: Diagnosis not present

## 2023-11-23 DIAGNOSIS — Z7409 Other reduced mobility: Secondary | ICD-10-CM

## 2023-11-23 DIAGNOSIS — M25572 Pain in left ankle and joints of left foot: Secondary | ICD-10-CM

## 2023-11-23 NOTE — Therapy (Signed)
 OUTPATIENT PHYSICAL THERAPY LOWER EXTREMITY TREATMENT/DISCHARGE   Patient Name: Holly Gilmore MRN: [id number] DOB:07-11-1945, 78 y.o., female Today's Date: 11/23/2023  PHYSICAL THERAPY DISCHARGE SUMMARY  Visits from Start of Care: 3  Current functional level related to goals / functional outcomes: See below   Remaining deficits: See below   Education / Equipment: See below   Patient agrees to discharge. Patient goals were partially met. Patient is being discharged due to being pleased with the current functional level.    END OF SESSION:  PT End of Session - 11/23/23 1300     Visit Number 3    Number of Visits 5    Date for PT Re-Evaluation 11/28/23    Authorization Type [employer]    Authorization Time Period No auth    Progress Note Due on Visit 5    PT Start Time 1301    PT Stop Time 1325    PT Time Calculation (min) 24 min    Activity Tolerance Patient tolerated treatment well    Behavior During Therapy WFL for tasks assessed/performed          Past Medical History:  Diagnosis Date   Hepatitis A    Hypertension    Past Surgical History:  Procedure Laterality Date   ABDOMINAL HYSTERECTOMY     CHOLECYSTECTOMY     Patient Active Problem List   Diagnosis Date Noted   Allergic sinusitis 09/21/2023   History of CVA (cerebrovascular accident) 09/21/2023   Dysphagia 11/27/2021   Hyperglycemia 08/24/2021   Tobacco abuse 08/24/2021   Essential hypertension, benign 01/06/2016   Hyperlipidemia 01/06/2016   Heart murmur 01/06/2016    PCP: Tobie Suzzane POUR, MD  REFERRING PROVIDER: Margrette Taft BRAVO, MD  REFERRING DIAG: [id number] (ICD-10-CM) - Pain in left ankle and joints of left foot M76.62 (ICD-10-CM) - Insertional tendinopathy of left Achilles tendon  THERAPY DIAG:  Decreased range of motion of left ankle  Impaired functional mobility, balance, gait, and endurance  Pain in left ankle and joints of left foot  Rationale for  Evaluation and Treatment: Rehabilitation  ONSET DATE: About a month and a half  SUBJECTIVE:   SUBJECTIVE STATEMENT: Patient reports no pain in ankle. Reports she hasn't had pain in a little while, only when she walks barefoot. But that is not often.   Eval:  Patient reports L ankle pain began about a month and a half ago. Reports she thinks it was due to her new sketchers, felt they were putting more pressure on her feet and back of ankle. Hasn't been able to wear them since. Reports inc difficulty with prolonged walking sometimes and increased pain when walking barefoot. Some pain when walking with shoes as well but not as bad as bare foot.    PERTINENT HISTORY: L Rotator cuff injury/surgery PAIN:  Are you having pain? Yes: NPRS scale: 0/10 Pain location: Lateral L ankle Pain description: Uneasiness Aggravating factors: Walking, crossing at ankles when laying down,  Relieving factors: Pain medication, Ice  PRECAUTIONS: None  RED FLAGS: None   WEIGHT BEARING RESTRICTIONS: No  FALLS:  Has patient fallen in last 6 months? No  LIVING ENVIRONMENT: Stairs: Yes: Internal: 12 steps; can reach both  OCCUPATION: Retired, Administration   PLOF: Independent  PATIENT GOALS: Would like for this to be normal again  NEXT MD VISIT: 3 months  OBJECTIVE:  Note: Objective measures were completed at Evaluation unless otherwise noted.  DIAGNOSTIC FINDINGS:  Heel pain   X-ray shows prominent dorsal calcaneal  area.  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   Impression prominent calcaneus can lead to pump bump syndrome Achilles tendinosis tendinitis  PATIENT SURVEYS:  Lower Extremity Functional Score: 76 / 80 = 95.0 %  11/23/23: Lower Extremity Functional Score: 80 / 80 = 100.0 %  COGNITION: Overall cognitive status: Within functional limits for tasks assessed     SENSATION: WFL   POSTURE: rounded shoulders and forward head  PALPATION: Tenderness with  moderate palpation in inferior and posterior areas of lateral and medial malleoli on L ankle Tightness noted in L Achilles   LOWER EXTREMITY ROM:  Active ROM Right eval Left eval Left 11/23/23   Hip flexion     Hip extension     Hip abduction     Hip adduction     Hip internal rotation     Hip external rotation     Knee flexion     Knee extension     Ankle dorsiflexion  12 20  Ankle plantarflexion  60 50  Ankle inversion     Ankle eversion      (Blank rows = not tested)  LOWER EXTREMITY MMT:  MMT Right eval Left eval  Hip flexion    Hip extension    Hip abduction    Hip adduction    Hip internal rotation    Hip external rotation    Knee flexion  4-  Knee extension  4+  Ankle dorsiflexion  5  Ankle plantarflexion  5  Ankle inversion    Ankle eversion     (Blank rows = not tested)  LOWER EXTREMITY SPECIAL TESTS:    FUNCTIONAL TESTS:  30 seconds chair stand test 2 minute walk test: 435' 30 seconds chair stand test: 11 STS, no UE use  11/23/23: 30 seconds chair stand test: 11 STS, UE use 2 minute walk test: 510'  GAIT: Distance walked: 435 Assistive device utilized: None Level of assistance: Complete Independence Comments: Inc forward lean/kyphosis, dec hip ext bilat, mild R/L drifting throughout                                                                                                                                 TREATMENT DATE:  11/23/23: Progress Note: LEFS 30 second sit to stand 2 minute walk test L ankle ROM Review of goals and HEP   11/17/23:   Reviewed goals Educated importance of HEP compliance for maximal benefits PT able to recall:  Standing:   Heel raise   Toe raises   Hamstring curls  Seated heel slide  Standing:  SLS Rt 9, Lt 6  Slant board 1x 30  Gastroc stretch 2x 30  Abduction 2x 10 STS with GTB around thigh to address valgus   10/31/23: PT Eval and HEP    PATIENT EDUCATION:  Education details: PT  evaluation, objective findings, POC, Importance of HEP, Precautions, Clinic policies Person educated: Patient Education  method: Explanation and Demonstration Education comprehension: verbalized understanding and returned demonstration  HOME EXERCISE PROGRAM: Access Code: [email or web address] URL: [email or web address] Date: 10/31/2023 Prepared by: Rosaria Powell-Butler  Exercises - Seated Heel Slide  - 2 x daily - 7 x weekly - 3 sets - 10 reps - Heel Raises with Counter Support  - 2 x daily - 7 x weekly - 3 sets - 10 reps - Toe Raises with Counter Support  - 2 x daily - 7 x weekly - 3 sets - 10 reps - Standing Knee Flexion AROM with Chair Support  - 2 x daily - 7 x weekly - 3 sets - 10 reps  11/17/23: - Single Leg Stance  - 2 x daily - 7 x weekly - 1 sets - 3 reps - 30 hold - Standing Hip Abduction with Unilateral Counter Support  - 2 x daily - 7 x weekly - 1 sets - 10 reps - 5 hold - Gastroc Stretch on Wall  - 2 x daily - 7 x weekly - 1 sets - 3 reps - 30 hold  ASSESSMENT:  CLINICAL IMPRESSION: Progress note performed this date. Patient demonstrating improvements with L ankle ROM, and during functional tests including 30 second sit to stand and 2 minute walk test. Patient reports no daily pain and no functional deficits with due to L ankle pain. Patient reports feeling comfortable with managing progress at home with discharge to HEP at this time. Review of HEP this date and patient discharged this date.     Eval:  Patient is a 78 y.o. female who was seen today for physical therapy evaluation and treatment for M25.572 (ICD-10-CM) - Pain in left ankle and joints of left foot M76.62 (ICD-10-CM) - Insertional tendinopathy of left Achilles tendon. Patient arrives to PT evaluation with reports of inc pain in L ankle at times, especially with prolonged walking and standing. Patient demonstrated dec DF ROM of L ankle, dec hamstring strength, impaired posture, and some mild balance impairments  during ambulation that may all be contributing to patient pain. Recommendation given for patient to ice and elevate L ankle at end of day to alleviate some mild swelling that occurs. Patient will benefit from skilled physical therapy in order to address the above to improve function and QOL.    OBJECTIVE IMPAIRMENTS: Abnormal gait, decreased activity tolerance, decreased balance, decreased endurance, decreased mobility, decreased ROM, decreased strength, increased edema, postural dysfunction, and pain.   ACTIVITY LIMITATIONS: standing, stairs, transfers, and locomotion level  PARTICIPATION LIMITATIONS: community activity and yard work  PERSONAL FACTORS: N/A are also affecting patient's functional outcome.   REHAB POTENTIAL: Good  CLINICAL DECISION MAKING: Stable/uncomplicated  EVALUATION COMPLEXITY: Low   GOALS: Goals reviewed with patient? Yes  SHORT TERM GOALS: Target date: 11/14/23 Patient will be independent with performance of HEP to demonstrate adequate self management of symptoms.  Baseline: Does not report consistent daily compliance Goal status: ADEQUATE FOR DISCHARGE   2.   Patient will report at least a 25% improvement with function or pain overall since beginning PT. Baseline: 85% ON 11/23/23 Goal status: MET   LONG TERM GOALS: Target date: 11/28/23 Patient will improve LEFS score by 9 points to demonstrate improved perceived function while meeting MCID.  Baseline: Goal status: MET 2.  Patient will improve L ankle DF ROM to at least 20 degrees to show improved ankle mobility for improve functional transfers and QOL.  Baseline:  Goal status: MET 3.  Patient will improve 30 seconds chair stand test  by at least 2 STS in order to demonstrate improved LE strength and endurance needed for functional activities such as gardening.  Baseline:  Goal status: ADEQUATE FOR DISCHARGE    4.  Patient will report pain level of 2/10 or less during and after walking about a mile in  order to ambulate community distances with ease. Baseline: 0/10 on 11/23/23 Goal status: MET    PLAN:  PT FREQUENCY: 1x/week  PT DURATION: 4 weeks  PLANNED INTERVENTIONS: 97164- PT Re-evaluation, 97110-Therapeutic exercises, 97530- Therapeutic activity, 97112- Neuromuscular re-education, 97535- Self Care, 02859- Manual therapy, [phone]- Gait training, [phone]- Electrical stimulation (manual), [phone] (1-2 muscles), 20561 (3+ muscles)- Dry Needling, Patient/Family education, Balance training, Stair training, Taping, Joint mobilization, Spinal mobilization, Cryotherapy, and Moist heat  PLAN FOR NEXT SESSION: Discharged   1:33 PM, 11/23/23 Rosaria Settler, PT, DPT [facility] [place]

## 2023-11-28 ENCOUNTER — Encounter: Payer: Self-pay | Admitting: Orthopedic Surgery

## 2023-11-28 ENCOUNTER — Ambulatory Visit: Admitting: Orthopedic Surgery

## 2023-11-28 DIAGNOSIS — M7662 Achilles tendinitis, left leg: Secondary | ICD-10-CM

## 2023-11-28 NOTE — Progress Notes (Signed)
   There were no vitals taken for this visit.  There is no height or weight on file to calculate BMI.  Chief Complaint  Patient presents with   Foot Pain    Left     No diagnosis found.  DOI/DOS/ Date:    Improved

## 2023-11-28 NOTE — Progress Notes (Signed)
 Follow-up  Chief Complaint  Patient presents with   Foot Pain    Left     Encounter Diagnosis  Name Primary?   Insertional tendinopathy of left Achilles tendon Yes    Miquel is doing well she has been able to perform activities in her flower bed occasional swelling responds well to oral medication and activity modification  She is ambulatory now with no assistive device no limping No tenderness  Full range of motion  Improved follow-up as needed

## 2023-11-30 ENCOUNTER — Encounter (HOSPITAL_COMMUNITY)

## 2023-12-07 ENCOUNTER — Encounter (HOSPITAL_COMMUNITY)

## 2024-01-11 ENCOUNTER — Ambulatory Visit (INDEPENDENT_AMBULATORY_CARE_PROVIDER_SITE_OTHER)

## 2024-01-11 VITALS — Ht 66.0 in | Wt 143.0 lb

## 2024-01-11 DIAGNOSIS — Z0000 Encounter for general adult medical examination without abnormal findings: Secondary | ICD-10-CM | POA: Diagnosis not present

## 2024-01-11 DIAGNOSIS — Z720 Tobacco use: Secondary | ICD-10-CM

## 2024-01-11 DIAGNOSIS — F17210 Nicotine dependence, cigarettes, uncomplicated: Secondary | ICD-10-CM

## 2024-01-11 DIAGNOSIS — Z5320 Procedure and treatment not carried out because of patient's decision for unspecified reasons: Secondary | ICD-10-CM

## 2024-01-11 DIAGNOSIS — Z1159 Encounter for screening for other viral diseases: Secondary | ICD-10-CM

## 2024-01-11 NOTE — Patient Instructions (Signed)
 Holly Gilmore , Thank you for taking time out of your busy schedule to complete your Annual Wellness Visit with me. I enjoyed our conversation and look forward to speaking with you again next year. I, as well as your care team,  appreciate your ongoing commitment to your health goals. Please review the following plan we discussed and let me know if I can assist you in the future.  Your Game plan/ To Do List  Referrals/Orders: If you haven't heard from the office you've been referred to, please reach out to them at the phone provided.   Lung Cancer Screening-[place] Office [address]-First Floor Medical Building directly across from [facility] Phone [phone]  Lab Orders         Hepatitis C antibody    Have these labs completed at [facility].   Follow up Visits: We will see or speak with you next year for your Next Medicare AWV with our clinical staff  Clinician Recommendations:  Aim for 30 minutes of exercise or brisk walking, 6-8 glasses of water, and 5 servings of fruits and vegetables each day.    Wishing you many blessings and good health during the next year until our next visit.  -Jaclyn Carew   This is a list of the screenings recommended for you:  Health Maintenance  Topic Date Due   COVID-19 Vaccine (1) Never done   Hepatitis C Screening  Never done   Zoster (Shingles) Vaccine (1 of 2) 06/08/1963   Pneumococcal Vaccine for age over 63 (2 of 2 - PCV) 05/18/2011   Screening for Lung Cancer  05/14/2023   Flu Shot  12/16/2023   Medicare Annual Wellness Visit  01/10/2025   DTaP/Tdap/Td vaccine (2 - Td or Tdap) 04/03/2033   DEXA scan (bone density measurement)  Completed   HPV Vaccine  Aged Out   Meningitis B Vaccine  Aged Out    Advanced directives: (Copy Requested) Please bring a copy of your health care power of attorney and living will to the office to be added to your chart at your convenience. You can mail to [facility] [address] [place], [place] [address] or email to [email or web address] Advance Care Planning is important because it:  [x]  Makes sure you receive the medical care that is consistent with your values, goals, and preferences  [x]  It provides guidance to your family and loved ones and reduces their decisional burden about whether or not they are making the right decisions based on your wishes.  Follow the link provided in your after visit summary or read over the paperwork we have mailed to you to help you started getting your Advance Directives in place. If you need assistance in completing these, please reach out to us  so that we can help you!  Information on Advanced Care Planning can be found at [place]  Secretary of [facility] Advance Health Care Directives Advance Health Care Directives ([email or web address]   See attachments for Preventive Care and Fall Prevention Tips.

## 2024-01-11 NOTE — Progress Notes (Signed)
 Subjective:   Holly Gilmore is a 78 y.o. who presents for a Medicare Wellness preventive visit.  As a reminder, Annual Wellness Visits don't include a physical exam, and some assessments may be limited, especially if this visit is performed virtually. We may recommend an in-person follow-up visit with your provider if needed.  Visit Complete: Virtual I connected with  Holly Gilmore on 01/11/24 by a audio enabled telemedicine application and verified that I am speaking with the correct person using two identifiers.  Patient Location: Home  Provider Location: Office/Clinic  I discussed the limitations of evaluation and management by telemedicine. The patient expressed understanding and agreed to proceed.  Vital Signs: Because this visit was a virtual/telehealth visit, some criteria may be missing or patient reported. Any vitals not documented were not able to be obtained and vitals that have been documented are patient reported.  VideoDeclined- This patient declined Librarian, academic. Therefore the visit was completed with audio only.  Persons Participating in Visit: Patient.  AWV Questionnaire: No: Patient Medicare AWV questionnaire was not completed prior to this visit.  Cardiac Risk Factors include: advanced age (>30men, >47 women);hypertension;smoking/ tobacco exposure     Objective:    Today's Vitals   01/11/24 1128  Weight: 143 lb (64.9 kg)  Height: 5' 6 (1.676 m)  PainSc: 0-No pain   Body mass index is 23.08 kg/m.     01/11/2024   11:24 AM 10/31/2023    1:04 PM 04/04/2023    4:07 PM 08/25/2021    4:35 AM 08/25/2021    4:00 AM 08/24/2021    8:28 AM 03/09/2017   11:35 AM  Advanced Directives  Does Patient Have a Medical Advance Directive? Yes Yes No  No Yes Yes   Type of Estate agent of [place];Living will     Healthcare Power of [place];Living will Healthcare Power of [place];Living will  Copy of  Healthcare Power of Attorney in Chart? No - copy requested     No - copy requested No - copy requested   Would patient like information on creating a medical advance directive?   No - Patient declined No - Patient declined        Data saved with a previous flowsheet row definition    Current Medications (verified) Outpatient Encounter Medications as of 01/11/2024  Medication Sig   albuterol  (VENTOLIN  HFA) 108 (90 Base) MCG/ACT inhaler Inhale 2 puffs into the lungs every 4 (four) hours.   amLODipine  (NORVASC ) 5 MG tablet Take 1 tablet (5 mg total) by mouth daily.   aspirin  EC 81 MG tablet Take 81 mg by mouth daily. Swallow whole.   benazepril  (LOTENSIN ) 10 MG tablet Take 10 mg by mouth daily.   Cholecalciferol (VITAMIN D-3 PO) Take by mouth.   cycloSPORINE  (RESTASIS ) 0.05 % ophthalmic emulsion 1 drop 2 (two) times daily.   fluticasone  (FLONASE ) 50 MCG/ACT nasal spray Place 2 sprays into both nostrils daily.   meloxicam  (MOBIC ) 7.5 MG tablet Take 1 tablet (7.5 mg total) by mouth daily.   Multiple Vitamins-Minerals (MULTIVITAMIN) tablet Take 1 tablet by mouth daily.   Omega 3 1200 MG CAPS Takes daily   [DISCONTINUED] benazepril  (LOTENSIN ) 20 MG tablet Take 1 tablet (20 mg total) by mouth daily.   [DISCONTINUED] vitamin C (ASCORBIC ACID) 500 MG tablet Take 1,000 mg by mouth daily.   No facility-administered encounter medications on file as of 01/11/2024.    Allergies (verified) Penicillins   History: Past Medical History:  Diagnosis Date   Hepatitis A    Hypertension    Past Surgical History:  Procedure Laterality Date   ABDOMINAL HYSTERECTOMY     CHOLECYSTECTOMY     Family History  Problem Relation Age of Onset   Arthritis Mother    Hypertension Mother    Cancer Father        prost   Alcohol abuse Brother    Social History   Socioeconomic History   Marital status: Widowed    Spouse name: Not on file   Number of children: Not on file   Years of education: Not on file    Highest education level: Not on file  Occupational History   Not on file  Tobacco Use   Smoking status: Every Day    Current packs/day: 2.00    Average packs/day: 2.0 packs/day for 47.9 years (95.9 ttl pk-yrs)    Types: Cigarettes    Start date: 01/30/1976   Smokeless tobacco: Never  Vaping Use   Vaping status: Never Used  Substance and Sexual Activity   Alcohol use: Yes    Comment: occasional   Drug use: No   Sexual activity: Not on file  Other Topics Concern   Not on file  Social History Narrative   No regular exercise   Gardening, canning   Social Drivers of Health   Financial Resource Strain: Low Risk  (01/11/2024)   Overall Financial Resource Strain (CARDIA)    Difficulty of Paying Living Expenses: Not hard at all  Food Insecurity: No Food Insecurity (01/11/2024)   Hunger Vital Sign    Worried About Running Out of Food in the Last Year: Never true    Ran Out of Food in the Last Year: Never true  Transportation Needs: No Transportation Needs (01/11/2024)   PRAPARE - Administrator, Civil Service (Medical): No    Lack of Transportation (Non-Medical): No  Physical Activity: Sufficiently Active (01/11/2024)   Exercise Vital Sign    Days of Exercise per Week: 7 days    Minutes of Exercise per Session: 30 min  Stress: No Stress Concern Present (01/11/2024)   [employer] of Occupational Health - Occupational Stress Questionnaire    Feeling of Stress: Not at all  Social Connections: Moderately Isolated (01/11/2024)   Social Connection and Isolation Panel    Frequency of Communication with Friends and Family: More than three times a week    Frequency of Social Gatherings with Friends and Family: Twice a week    Attends Religious Services: Never    Database administrator or Organizations: Yes    Attends Banker Meetings: Never    Marital Status: Widowed    Tobacco Counseling Ready to quit: Yes Counseling given: Yes    Clinical  Intake:  Pre-visit preparation completed: Yes  Pain : No/denies pain Pain Score: 0-No pain     BMI - recorded: 23.08 Nutritional Status: BMI of 19-24  Normal Nutritional Risks: None Diabetes: No  Lab Results  Component Value Date   HGBA1C 5.3 08/25/2021     How often do you need to have someone help you when you read instructions, pamphlets, or other written materials from your doctor or pharmacy?: 1 - Never  Interpreter Needed?: No  Information entered by :: Stancil Deisher W CMA (AAMA)   Activities of Daily Living     01/11/2024   11:34 AM  In your present state of health, do you have any difficulty performing the following activities:  Hearing? 0  Vision? 0  Difficulty concentrating or making decisions? 0  Walking or climbing stairs? 0  Dressing or bathing? 0  Doing errands, shopping? 0  Preparing Food and eating ? N  Using the Toilet? N  In the past six months, have you accidently leaked urine? N  Do you have problems with loss of bowel control? N  Managing your Medications? N  Managing your Finances? N  Housekeeping or managing your Housekeeping? N    Patient Care Team: Tobie Suzzane POUR, MD as PCP - General (Internal Medicine) Shaaron Lamar HERO, MD as Consulting Physician (Gastroenterology) Margrette Taft BRAVO, MD as Consulting Physician (Orthopedic Surgery) Shona Rush, MD (Dermatology)  I have updated your Care Teams any recent Medical Services you may have received from other providers in the past year.     Assessment:   This is a routine wellness examination for Hisae.  Hearing/Vision screen Hearing Screening - Comments:: Patient denies any hearing difficulties.   Vision Screening - Comments:: Wears rx glasses - up to date with routine eye exams with  [facility] in [place] (Dr. Robynn Mill)   Goals Addressed             This Visit's Progress    maintain current health status   On track    Continue to be as active and as independent as  possible       Depression Screen     01/11/2024   11:35 AM 09/21/2023   12:56 PM 03/09/2017   11:35 AM 01/06/2016   10:09 AM  PHQ 2/9 Scores  PHQ - 2 Score 0 0 0 0  PHQ- 9 Score 0 0 1     Fall Risk     01/11/2024   11:31 AM 09/21/2023   12:56 PM 04/11/2019   11:47 AM 12/18/2018    3:00 PM 03/09/2017   11:35 AM  Fall Risk   Falls in the past year? 0 0 0  --  No   Comment   [facility] Telephone Survey: data to providers prior to load  [place] Survey: data to providers prior to load    Number falls in past yr: 0 0  --    Comment    [facility]i Telephone Survey Actual Response =     Injury with Fall? 0 0     Risk for fall due to : No Fall Risks No Fall Risks     Follow up Falls evaluation completed;Education provided;Falls prevention discussed Falls evaluation completed        Data saved with a previous flowsheet row definition    MEDICARE RISK AT HOME:  Medicare Risk at Home Any stairs in or around the home?: Yes If so, are there any without handrails?: No Home free of loose throw rugs in walkways, pet beds, electrical cords, etc?: Yes Adequate lighting in your home to reduce risk of falls?: Yes Life alert?: No Use of a cane, walker or w/c?: No Grab bars in the bathroom?: Yes Shower chair or bench in shower?: Yes Elevated toilet seat or a handicapped toilet?: Yes  TIMED UP AND GO:  Was the test performed?  No  Cognitive Function: 6CIT completed    03/09/2017   11:37 AM  MMSE - Mini Mental State Exam  Orientation to time 5      Data saved with a previous flowsheet row definition        01/11/2024   11:34 AM  6CIT Screen  What Year? 0  points  What month? 0 points  What time? 0 points  Count back from 20 0 points  Months in reverse 0 points  Repeat phrase 0 points  Total Score 0 points    Immunizations Immunization History  Administered Date(s) Administered   Influenza-Unspecified 02/23/2016, 01/20/2017   Pneumococcal Polysaccharide-23 05/17/2010   Tdap  04/04/2023   Zoster, Live 05/18/2003    Screening Tests Health Maintenance  Topic Date Due   COVID-19 Vaccine (1) Never done   Hepatitis C Screening  Never done   Zoster Vaccines- Shingrix (1 of 2) 06/08/1963   Pneumococcal Vaccine: 50+ Years (2 of 2 - PCV) 05/18/2011   Lung Cancer Screening  05/14/2023   INFLUENZA VACCINE  12/16/2023   Medicare Annual Wellness (AWV)  01/10/2025   DTaP/Tdap/Td (2 - Td or Tdap) 04/03/2033   DEXA SCAN  Completed   HPV VACCINES  Aged Out   Meningococcal B Vaccine  Aged Out    Health Maintenance  Health Maintenance Due  Topic Date Due   COVID-19 Vaccine (1) Never done   Hepatitis C Screening  Never done   Zoster Vaccines- Shingrix (1 of 2) 06/08/1963   Pneumococcal Vaccine: 50+ Years (2 of 2 - PCV) 05/18/2011   Lung Cancer Screening  05/14/2023   INFLUENZA VACCINE  12/16/2023   Health Maintenance Items Addressed: Referral sent to [facility] (smoker/hx smoking), Labs Ordered: Hep C  Additional Screening:  Vision Screening: Recommended annual ophthalmology exams for early detection of glaucoma and other disorders of the eye. Would you like a referral to an eye doctor? No    Dental Screening: Recommended annual dental exams for proper oral hygiene  Community Resource Referral / Chronic Care Management: CRR required this visit?  No   CCM required this visit?  No   Plan:    I have personally reviewed and noted the following in the patient's chart:   Medical and social history Use of alcohol, tobacco or illicit drugs  Current medications and supplements including opioid prescriptions. Patient is not currently taking opioid prescriptions. Functional ability and status Nutritional status Physical activity Advanced directives List of other physicians Hospitalizations, surgeries, and ER visits in previous 12 months Vitals Screenings to include cognitive, depression, and falls Referrals and appointments  In addition, I have  reviewed and discussed with patient certain preventive protocols, quality metrics, and best practice recommendations. A written personalized care plan for preventive services as well as general preventive health recommendations were provided to patient.   Johntae Broxterman, CMA   01/11/2024   After Visit Summary: (Mail) Due to this being a telephonic visit, the after visit summary with patients personalized plan was offered to patient via mail   Notes: Nothing significant to report at this time.

## 2024-02-14 DIAGNOSIS — Z1159 Encounter for screening for other viral diseases: Secondary | ICD-10-CM | POA: Diagnosis not present

## 2024-02-14 DIAGNOSIS — I10 Essential (primary) hypertension: Secondary | ICD-10-CM | POA: Diagnosis not present

## 2024-02-14 DIAGNOSIS — E782 Mixed hyperlipidemia: Secondary | ICD-10-CM | POA: Diagnosis not present

## 2024-02-14 DIAGNOSIS — R739 Hyperglycemia, unspecified: Secondary | ICD-10-CM | POA: Diagnosis not present

## 2024-02-15 LAB — CBC WITH DIFFERENTIAL/PLATELET
Basophils Absolute: 0 x10E3/uL (ref 0.0–0.2)
Basos: 1 %
EOS (ABSOLUTE): 0.1 x10E3/uL (ref 0.0–0.4)
Eos: 1 %
Hematocrit: 45.9 % (ref 34.0–46.6)
Hemoglobin: 15.2 g/dL (ref 11.1–15.9)
Immature Grans (Abs): 0 x10E3/uL (ref 0.0–0.1)
Immature Granulocytes: 0 %
Lymphocytes Absolute: 2.1 x10E3/uL (ref 0.7–3.1)
Lymphs: 32 %
MCH: 32.5 pg (ref 26.6–33.0)
MCHC: 33.1 g/dL (ref 31.5–35.7)
MCV: 98 fL — ABNORMAL HIGH (ref 79–97)
Monocytes Absolute: 0.6 x10E3/uL (ref 0.1–0.9)
Monocytes: 10 %
Neutrophils Absolute: 3.6 x10E3/uL (ref 1.4–7.0)
Neutrophils: 56 %
Platelets: 283 x10E3/uL (ref 150–450)
RBC: 4.68 x10E6/uL (ref 3.77–5.28)
RDW: 11.5 % — ABNORMAL LOW (ref 11.7–15.4)
WBC: 6.5 x10E3/uL (ref 3.4–10.8)

## 2024-02-15 LAB — CMP14+EGFR
ALT: 13 IU/L (ref 0–32)
AST: 22 IU/L (ref 0–40)
Albumin: 4 g/dL (ref 3.8–4.8)
Alkaline Phosphatase: 92 IU/L (ref 49–135)
BUN/Creatinine Ratio: 19 (ref 12–28)
BUN: 14 mg/dL (ref 8–27)
Bilirubin Total: 0.5 mg/dL (ref 0.0–1.2)
CO2: 25 mmol/L (ref 20–29)
Calcium: 10 mg/dL (ref 8.7–10.3)
Chloride: 105 mmol/L (ref 96–106)
Creatinine, Ser: 0.75 mg/dL (ref 0.57–1.00)
Globulin, Total: 3 g/dL (ref 1.5–4.5)
Glucose: 91 mg/dL (ref 70–99)
Potassium: 5.2 mmol/L (ref 3.5–5.2)
Sodium: 142 mmol/L (ref 134–144)
Total Protein: 7 g/dL (ref 6.0–8.5)
eGFR: 81 mL/min/1.73 (ref >59)

## 2024-02-15 LAB — LIPID PANEL
Chol/HDL Ratio: 3.4 ratio (ref 0.0–4.4)
Cholesterol, Total: 222 mg/dL — ABNORMAL HIGH (ref 100–199)
HDL: 65 mg/dL (ref >39)
LDL Chol Calc (NIH): 136 mg/dL — ABNORMAL HIGH (ref 0–99)
Triglycerides: 120 mg/dL (ref 0–149)
VLDL Cholesterol Cal: 21 mg/dL (ref 5–40)

## 2024-02-15 LAB — HEMOGLOBIN A1C
Est. average glucose Bld gHb Est-mCnc: 111 mg/dL
Hgb A1c MFr Bld: 5.5 % (ref 4.8–5.6)

## 2024-02-15 LAB — HEPATITIS C ANTIBODY: Hep C Virus Ab: NONREACTIVE

## 2024-02-21 ENCOUNTER — Encounter: Payer: Self-pay | Admitting: Internal Medicine

## 2024-02-21 ENCOUNTER — Ambulatory Visit (INDEPENDENT_AMBULATORY_CARE_PROVIDER_SITE_OTHER): Admitting: Internal Medicine

## 2024-02-21 VITALS — BP 133/70 | HR 83 | Ht 66.0 in | Wt 137.8 lb

## 2024-02-21 DIAGNOSIS — N393 Stress incontinence (female) (male): Secondary | ICD-10-CM | POA: Insufficient documentation

## 2024-02-21 DIAGNOSIS — I10 Essential (primary) hypertension: Secondary | ICD-10-CM

## 2024-02-21 DIAGNOSIS — Z0001 Encounter for general adult medical examination with abnormal findings: Secondary | ICD-10-CM | POA: Insufficient documentation

## 2024-02-21 DIAGNOSIS — E782 Mixed hyperlipidemia: Secondary | ICD-10-CM | POA: Diagnosis not present

## 2024-02-21 DIAGNOSIS — Z8673 Personal history of transient ischemic attack (TIA), and cerebral infarction without residual deficits: Secondary | ICD-10-CM

## 2024-02-21 MED ORDER — BENAZEPRIL HCL 20 MG PO TABS: 20.0000 mg | ORAL_TABLET | Freq: Every day | ORAL | 3 refills | Status: AC

## 2024-02-21 NOTE — Assessment & Plan Note (Signed)
 BP Readings from Last 1 Encounters:  02/21/24 133/70   Well-controlled with amlodipine  5 mg QD and benazepril  20 mg QD, refilled Counseled for compliance with the medications Advised DASH diet and moderate exercise/walking as tolerated

## 2024-02-21 NOTE — Progress Notes (Signed)
 Established Patient Office Visit  Subjective:  Patient ID: Holly Gilmore, female    DOB: [birth year]  Age: 78 y.o. MRN: [id number]  CC:  Chief Complaint  Patient presents with   Hypertension    5 month f/u    Hyperlipidemia    5 month f/u     HPI Holly Gilmore is a 78 y.o. female with past medical history of HTN, TIA, HLD, allergic sinusitis and tobacco abuse who presents for f/u of her chronic medical conditions.  HTN: BP is well-controlled. Takes amlodipine  5 mg QD and benazepril  20 mg QD regularly. Patient denies headache, dizziness, chest pain, dyspnea or palpitations.  History of TIA: She had TIA in 2023, was admitted at [facility].  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  She was placed on aspirin  and statin, but did not continue statin.  After discussion again today, she still denies to start statin.  Her last lipid profile showed elevated LDL 133, which is similar to prior.  She understands the risk of recurrent CVA.  Allergic rhinitis/sinusitis: She has chronic nasal congestion and rhinorrhea.  She uses Flonase  for it.  She smokes about 1.5 pack/day, has smoked for 60 years. She has mild cough at times, but denies any dyspnea or wheezing currently.  She has albuterol  inhaler, but rarely needs it.  She denies lung cancer screening.  She denies pneumococcal vaccine and flu vaccine today as well.  She reports urinary incontinence, especially with coughing and sneezing.  She has to wear undergarment liner due to daily leakage.  Denies any dysuria or hematuria.  She lives by herself, her son spends half of the week with her.  She is independent with her ADLs and IADLs.   Past Medical History:  Diagnosis Date   Hepatitis A    Hypertension     Past Surgical History:  Procedure Laterality Date   ABDOMINAL HYSTERECTOMY     CHOLECYSTECTOMY      Family History  Problem Relation Age of Onset   Arthritis Mother     Hypertension Mother    Cancer Father        prost   Alcohol abuse Brother     Social History   Socioeconomic History   Marital status: Widowed    Spouse name: Not on file   Number of children: Not on file   Years of education: Not on file   Highest education level: Not on file  Occupational History   Not on file  Tobacco Use   Smoking status: Every Day    Current packs/day: 2.00    Average packs/day: 2.0 packs/day for 48.1 years (96.1 ttl pk-yrs)    Types: Cigarettes    Start date: 01/30/1976   Smokeless tobacco: Never  Vaping Use   Vaping status: Never Used  Substance and Sexual Activity   Alcohol use: Yes    Comment: occasional   Drug use: No   Sexual activity: Not on file  Other Topics Concern   Not on file  Social History Narrative   No regular exercise   Gardening, canning   Social Drivers of Health   Financial Resource Strain: Low Risk  (01/11/2024)   Overall Financial Resource Strain (CARDIA)    Difficulty of Paying Living Expenses: Not hard at all  Food Insecurity: No Food Insecurity (01/11/2024)   Hunger Vital Sign    Worried About Running Out of Food in the Last Year: Never true  Ran Out of Food in the Last Year: Never true  Transportation Needs: No Transportation Needs (01/11/2024)   PRAPARE - Administrator, Civil Service (Medical): No    Lack of Transportation (Non-Medical): No  Physical Activity: Sufficiently Active (01/11/2024)   Exercise Vital Sign    Days of Exercise per Week: 7 days    Minutes of Exercise per Session: 30 min  Stress: No Stress Concern Present (01/11/2024)   [employer] of Occupational Health - Occupational Stress Questionnaire    Feeling of Stress: Not at all  Social Connections: Moderately Isolated (01/11/2024)   Social Connection and Isolation Panel    Frequency of Communication with Friends and Family: More than three times a week    Frequency of Social Gatherings with Friends and Family: Twice a week     Attends Religious Services: Never    Database administrator or Organizations: Yes    Attends Banker Meetings: Never    Marital Status: Widowed  Intimate Partner Violence: Not At Risk (01/11/2024)   Humiliation, Afraid, Rape, and Kick questionnaire    Fear of Current or Ex-Partner: No    Emotionally Abused: No    Physically Abused: No    Sexually Abused: No    ROS Review of Systems  Constitutional:  Negative for chills and fever.  HENT:  Positive for congestion and rhinorrhea. Negative for sore throat.   Eyes:  Negative for pain and discharge.  Respiratory:  Positive for cough (Mild, chronic). Negative for shortness of breath.   Cardiovascular:  Negative for chest pain and palpitations.  Gastrointestinal:  Negative for abdominal pain, diarrhea, nausea and vomiting.  Endocrine: Negative for polydipsia and polyuria.  Genitourinary:  Negative for dysuria and hematuria.  Musculoskeletal:  Negative for neck pain and neck stiffness.  Skin:  Negative for rash.  Neurological:  Negative for dizziness and weakness.  Psychiatric/Behavioral:  Negative for agitation and behavioral problems.     Objective:   Today's Vitals: BP 133/70   Pulse 83   Ht 5' 6 (1.676 m)   Wt 137 lb 12.8 oz (62.5 kg)   SpO2 100%   BMI 22.24 kg/m   Physical Exam Vitals reviewed.  Constitutional:      General: She is not in acute distress.    Appearance: She is not diaphoretic.  HENT:     Head: Normocephalic and atraumatic.     Nose: Congestion present.     Mouth/Throat:     Mouth: Mucous membranes are moist.  Eyes:     General: No scleral icterus.    Extraocular Movements: Extraocular movements intact.  Cardiovascular:     Rate and Rhythm: Normal rate and regular rhythm.     Heart sounds: Normal heart sounds. No murmur heard. Pulmonary:     Breath sounds: Normal breath sounds. No wheezing or rales.  Abdominal:     Palpations: Abdomen is soft.     Tenderness: There is no abdominal  tenderness.  Musculoskeletal:     Cervical back: Neck supple. No tenderness.     Right lower leg: No edema.     Left lower leg: No edema.  Skin:    General: Skin is warm.     Findings: No rash.  Neurological:     General: No focal deficit present.     Mental Status: She is alert and oriented to person, place, and time.     Cranial Nerves: No cranial nerve deficit.     Sensory: No  sensory deficit.     Motor: No weakness.  Psychiatric:        Mood and Affect: Mood normal.        Behavior: Behavior normal.     Assessment & Plan:   Problem List Items Addressed This Visit       Cardiovascular and Mediastinum   Essential hypertension, benign   BP Readings from Last 1 Encounters:  02/21/24 133/70   Well-controlled with amlodipine  5 mg QD and benazepril  20 mg QD, refilled Counseled for compliance with the medications Advised DASH diet and moderate exercise/walking as tolerated      Relevant Medications   benazepril  (LOTENSIN ) 20 MG tablet   Other Relevant Orders   CMP14+EGFR     Other   Hyperlipidemia   Last lipid profile reviewed from [facility] data Has elevated LDL Denies to take statin Advised to follow DASH diet      Relevant Medications   benazepril  (LOTENSIN ) 20 MG tablet   Other Relevant Orders   Lipid Profile   History of CVA (cerebrovascular accident)   Had TIA in 2023 On aspirin  81 mg once daily Denies to take statin despite counseling, understands risk of recurrent CVA Strongly emphasized importance to quit smoking      Relevant Orders   CMP14+EGFR   Stress incontinence   Her symptoms are likely due to stress incontinence Kegel exercises material provided If persistent or worsening symptoms, will refer to OB/GYN for pessary placement consideration      Encounter for general adult medical examination with abnormal findings - Primary   Physical exam as documented. Fasting blood tests reviewed today. Denies flu and pneumococcal vaccine  today. Advised to get Shingrix vaccines at local pharmacy.        Outpatient Encounter Medications as of 02/21/2024  Medication Sig   albuterol  (VENTOLIN  HFA) 108 (90 Base) MCG/ACT inhaler Inhale 2 puffs into the lungs every 4 (four) hours.   amLODipine  (NORVASC ) 5 MG tablet Take 1 tablet (5 mg total) by mouth daily.   Ascorbic Acid (VITAMIN C) 1000 MG tablet Take 1,000 mg by mouth daily.   aspirin  EC 81 MG tablet Take 81 mg by mouth daily. Swallow whole.   Cholecalciferol (VITAMIN D-3 PO) Take by mouth.   cycloSPORINE  (RESTASIS ) 0.05 % ophthalmic emulsion 1 drop 2 (two) times daily.   fluticasone  (FLONASE ) 50 MCG/ACT nasal spray Place 2 sprays into both nostrils daily.   meloxicam  (MOBIC ) 7.5 MG tablet Take 1 tablet (7.5 mg total) by mouth daily.   Multiple Vitamins-Minerals (MULTIVITAMIN) tablet Take 1 tablet by mouth daily.   Omega 3 1200 MG CAPS Takes daily   [DISCONTINUED] benazepril  (LOTENSIN ) 20 MG tablet Take 20 mg by mouth daily.   benazepril  (LOTENSIN ) 20 MG tablet Take 1 tablet (20 mg total) by mouth daily.   No facility-administered encounter medications on file as of 02/21/2024.    Follow-up: Return in about 6 months (around 08/21/2024) for HTN and HLD.   Suzzane MARLA Blanch, MD

## 2024-02-21 NOTE — Assessment & Plan Note (Signed)
 Had TIA in 2023 On aspirin  81 mg once daily Denies to take statin despite counseling, understands risk of recurrent CVA Strongly emphasized importance to quit smoking

## 2024-02-21 NOTE — Assessment & Plan Note (Addendum)
 Physical exam as documented. Fasting blood tests reviewed today. Denies flu and pneumococcal vaccine today. Advised to get Shingrix vaccines at local pharmacy.

## 2024-02-21 NOTE — Assessment & Plan Note (Signed)
 Last lipid profile reviewed from [facility] data Has elevated LDL Denies to take statin Advised to follow DASH diet

## 2024-02-21 NOTE — Assessment & Plan Note (Signed)
 Her symptoms are likely due to stress incontinence Kegel exercises material provided If persistent or worsening symptoms, will refer to OB/GYN for pessary placement consideration

## 2024-02-21 NOTE — Patient Instructions (Signed)
Please continue to take medications as prescribed.  Please continue to follow low salt diet and perform moderate exercise/walking as tolerated.  Please get fasting blood tests done before the next visit.

## 2024-08-21 ENCOUNTER — Ambulatory Visit: Admitting: Internal Medicine

## 2025-01-15 ENCOUNTER — Ambulatory Visit
